# Patient Record
Sex: Male | Born: 1990 | Race: Black or African American | Hispanic: No | Marital: Single | Smoking: Current some day smoker
Health system: Southern US, Community
[De-identification: ages and names within clinical notes are randomized; demographics above are authoritative.]

## PROBLEM LIST (undated history)

## (undated) DIAGNOSIS — K219 Gastro-esophageal reflux disease without esophagitis: Secondary | ICD-10-CM

## (undated) DIAGNOSIS — F909 Attention-deficit hyperactivity disorder, unspecified type: Secondary | ICD-10-CM

## (undated) HISTORY — PX: OPEN REPAIR PERIARTICULAR FRACTURE / DISLOCATION ELBOW: SUR901

---
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
## 2018-01-30 ENCOUNTER — Other Ambulatory Visit: Payer: Self-pay

## 2018-01-30 ENCOUNTER — Encounter (HOSPITAL_COMMUNITY): Payer: Self-pay | Admitting: Emergency Medicine

## 2018-01-30 DIAGNOSIS — Y9389 Activity, other specified: Secondary | ICD-10-CM | POA: Diagnosis not present

## 2018-01-30 DIAGNOSIS — F17210 Nicotine dependence, cigarettes, uncomplicated: Secondary | ICD-10-CM | POA: Diagnosis not present

## 2018-01-30 DIAGNOSIS — W260XXA Contact with knife, initial encounter: Secondary | ICD-10-CM | POA: Diagnosis not present

## 2018-01-30 DIAGNOSIS — S61012A Laceration without foreign body of left thumb without damage to nail, initial encounter: Secondary | ICD-10-CM | POA: Insufficient documentation

## 2018-01-30 DIAGNOSIS — Y9289 Other specified places as the place of occurrence of the external cause: Secondary | ICD-10-CM | POA: Diagnosis not present

## 2018-01-30 DIAGNOSIS — Y990 Civilian activity done for income or pay: Secondary | ICD-10-CM | POA: Diagnosis not present

## 2018-01-30 NOTE — ED Triage Notes (Signed)
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

## 2018-01-31 ENCOUNTER — Emergency Department (HOSPITAL_COMMUNITY)
Admission: EM | Admit: 2018-01-31 | Discharge: 2018-01-31 | Disposition: A | Discharge status: Home / Self Care | Payer: No Typology Code available for payment source | Attending: Emergency Medicine | Admitting: Emergency Medicine

## 2018-01-31 DIAGNOSIS — S61012A Laceration without foreign body of left thumb without damage to nail, initial encounter: Secondary | ICD-10-CM

## 2018-01-31 MED ORDER — LIDOCAINE-EPINEPHRINE (PF) 2 %-1:200000 IJ SOLN
10.0000 mL | Freq: Once | INTRAMUSCULAR | Status: AC
  Administered 2018-01-31: 10 mL
  Filled 2018-01-31: qty 20

## 2018-01-31 NOTE — Discharge Instructions (Addendum)
1. Medications: Tylenol or ibuprofen for pain, 2. Treatment: ice for swelling, keep wound clean with warm soap and water and keep bandage dry 3. Follow Up: Please return in 10 days to have your stitches removed or sooner if you have concerns. Return to the emergency department for increased redness, drainage of pus from the wound   WOUND CARE  Remove bandage and wash wound gently with mild soap and warm water daily. Reapply a new bandage after cleaning wound  Continue daily cleansing with soap and water until stitches are removed.  Do not apply any ointments or creams to the wound while stitches are in place, as this may cause delayed healing. Return if you experience any of the following signs of infection: Swelling, redness, pus drainage, streaking, fever >101.0 F  Return if you experience excessive bleeding that does not stop after 15-20 minutes of constant, firm pressure.

## 2018-01-31 NOTE — ED Provider Notes (Signed)
[facility]-EMERGENCY DEPT Provider Note   CSN: [id number] Arrival date & time: 01/30/18  2208     History   Chief Complaint Chief Complaint  Patient presents with  . Laceration    HPI Messi Twedt is a 27 y.o. male presenting for evaluation of the laceration.  Patient states that it o'clock in the evening he cut his left thumb with a knife while at work.  He reports mild throbbing pain.  He tried to get the bleeding to stop with direct pressure, but was able to do so.  He denies numbness or tingling of the thumb.  He denies injury elsewhere.  Tetanus is up-to-date.  He is not on blood thinners.  He has no medical problems, takes no medications daily.  HPI  History reviewed. No pertinent past medical history.  There are no active problems to display for this patient.   History reviewed. No pertinent surgical history.      Home Medications    Prior to Admission medications   Not on File    Family History History reviewed. No pertinent family history.  Social History Social History   Tobacco Use  . Smoking status: Current Some Day Smoker  . Smokeless tobacco: Never Used  Substance Use Topics  . Alcohol use: Not on file  . Drug use: Not on file     Allergies   Patient has no known allergies.   Review of Systems Review of Systems  Skin: Positive for wound.  Neurological: Negative for numbness.  Hematological: Does not bruise/bleed easily.     Physical Exam Updated Vital Signs BP (!) 162/64 (BP Location: Right Arm)   Pulse 71   Temp 98.1 F (36.7 C) (Oral)   Resp 17   Ht 5\' 8"  (1.727 m)   Wt 90.7 kg   SpO2 99%   BMI 30.41 kg/m   Physical Exam  Constitutional: He is oriented to person, place, and time. He appears well-developed and well-nourished. No distress.  HENT:  Head: Normocephalic and atraumatic.  Eyes: EOM are normal.  Neck: Normal range of motion.  Pulmonary/Chest: Effort normal.  Abdominal: He exhibits  no distension.  Musculoskeletal: Normal range of motion.  Full active range of motion of the thumb without difficulty.  Strength against resistance intact.  Good cap refill. 2-point discrimination intact.   Neurological: He is alert and oriented to person, place, and time. No sensory deficit.  Skin: Skin is warm. Capillary refill takes less than 2 seconds. No rash noted.  1.5 cm laceration of the radial aspect of the left thumb at the DIP joint with active bleeding.  Psychiatric: He has a normal mood and affect.  Nursing note and vitals reviewed.    ED Treatments / Results  Labs (all labs ordered are listed, but only abnormal results are displayed) Labs Reviewed - No data to display  EKG None  Radiology No results found.  Procedures .Marland KitchenLaceration Repair Date/Time: 01/31/2018 6:14 AM Performed by: Alveria Apley, PA-C Authorized by: Alveria Apley, PA-C   Consent:    Consent obtained:  Verbal   Consent given by:  Patient   Risks discussed:  Pain, poor cosmetic result, poor wound healing and infection Anesthesia (see MAR for exact dosages):    Anesthesia method:  Local infiltration   Local anesthetic:  Lidocaine 2% WITH epi Laceration details:    Location:  Finger   Finger location:  L thumb   Length (cm):  1.5   Depth (mm):  2 Repair  type:    Repair type:  Simple Pre-procedure details:    Preparation:  Patient was prepped and draped in usual sterile fashion Exploration:    Hemostasis achieved with:  Epinephrine   Wound exploration: wound explored through full range of motion and entire depth of wound probed and visualized     Wound extent: no foreign bodies/material noted, no muscle damage noted, no nerve damage noted and no tendon damage noted   Treatment:    Area cleansed with:  Soap and water   Amount of cleaning:  Standard   Irrigation solution:  Sterile water   Irrigation method:  Syringe Skin repair:    Repair method:  Sutures   Suture size:  4-0    Suture material:  Prolene   Suture technique:  Simple interrupted   Number of sutures:  2 Approximation:    Approximation:  Close Post-procedure details:    Dressing:  Bulky dressing   Patient tolerance of procedure:  Tolerated well, no immediate complications   (including critical care time)  Medications Ordered in ED Medications  lidocaine-EPINEPHrine (XYLOCAINE W/EPI) 2 %-1:200000 (PF) injection 10 mL (10 mLs Infiltration Given by Other 01/31/18 0431)     Initial Impression / Assessment and Plan / ED Course  I have reviewed the triage vital signs and the nursing notes.  Pertinent labs & imaging results that were available during my care of the patient were reviewed by me and considered in my medical decision making (see chart for details).     Pt presenting for evaluation of thumb laceration.  Physical exam reassuring, he is neurovascularly intact.  Patient has continued bleeding despite pressure dressing from EMS.  Full active range of motion of the thumb, doubt underlying fracture.  Do not believe imaging is necessary at this time.  Hemostasis obtained with epi, sutures placed as described above.  Wound care instructions given.  At this time, patient appears safe for discharge.  Return precautions given.  Patient states he understands and agrees to plan.  Final Clinical Impressions(s) / ED Diagnoses   Final diagnoses:  Laceration of left thumb without foreign body without damage to nail, initial encounter    ED Discharge Orders    None       Alveria Apley, PA-C 01/31/18 0615    Derwood Kaplan, MD 01/31/18 [phone]

## 2018-01-31 NOTE — ED Notes (Signed)
Pt in the lobby c/o numbness and tingling in his thumb. His dressing was taken off and redone. When the dressing was removed, pt's thumb was gushing out blood, but controlled with a pressure dressing.

## 2020-07-15 ENCOUNTER — Emergency Department (HOSPITAL_COMMUNITY)
Admission: EM | Admit: 2020-07-15 | Discharge: 2020-07-16 | Disposition: A | Discharge status: Home / Self Care | Payer: Self-pay | Attending: Emergency Medicine | Admitting: Emergency Medicine

## 2020-07-15 ENCOUNTER — Encounter (HOSPITAL_COMMUNITY): Payer: Self-pay | Admitting: Emergency Medicine

## 2020-07-15 ENCOUNTER — Other Ambulatory Visit: Payer: Self-pay

## 2020-07-15 ENCOUNTER — Emergency Department (HOSPITAL_COMMUNITY): Payer: Self-pay

## 2020-07-15 DIAGNOSIS — F17200 Nicotine dependence, unspecified, uncomplicated: Secondary | ICD-10-CM | POA: Insufficient documentation

## 2020-07-15 DIAGNOSIS — S61210A Laceration without foreign body of right index finger without damage to nail, initial encounter: Secondary | ICD-10-CM | POA: Insufficient documentation

## 2020-07-15 DIAGNOSIS — Y990 Civilian activity done for income or pay: Secondary | ICD-10-CM | POA: Insufficient documentation

## 2020-07-15 DIAGNOSIS — W260XXA Contact with knife, initial encounter: Secondary | ICD-10-CM | POA: Insufficient documentation

## 2020-07-15 DIAGNOSIS — Z23 Encounter for immunization: Secondary | ICD-10-CM | POA: Insufficient documentation

## 2020-07-15 DIAGNOSIS — Y9389 Activity, other specified: Secondary | ICD-10-CM | POA: Insufficient documentation

## 2020-07-15 NOTE — ED Notes (Signed)
Temporary dressing with coban in place from triage. Pt calm and resting in room.

## 2020-07-15 NOTE — ED Triage Notes (Signed)
Patient cut his right pointer finger on knife while cleaning it at work.

## 2020-07-16 MED ORDER — TETANUS-DIPHTH-ACELL PERTUSSIS 5-2.5-18.5 LF-MCG/0.5 IM SUSY
0.5000 mL | PREFILLED_SYRINGE | Freq: Once | INTRAMUSCULAR | Status: AC
  Administered 2020-07-16: 0.5 mL via INTRAMUSCULAR
  Filled 2020-07-16: qty 0.5

## 2020-07-16 MED ORDER — LIDOCAINE HCL (PF) 1 % IJ SOLN
5.0000 mL | Freq: Once | INTRAMUSCULAR | Status: DC
  Filled 2020-07-16: qty 30

## 2020-07-16 NOTE — ED Notes (Signed)
Pt verbalized understanding of d/c, pain management, and follow up care. Ambulatory with steady gait. Pt given gauze and tape for bandage change at home.

## 2020-07-16 NOTE — Discharge Instructions (Signed)
Can take tylenol or motrin for pain. Keep sutures clean and dry. You will need to follow-up with urgent care or your primary care doctor to have sutures removed in about 1 week. Return to the ED for new or worsening symptoms.

## 2020-07-16 NOTE — ED Provider Notes (Signed)
[facility]-EMERGENCY DEPT Provider Note   CSN: [id number] Arrival date & time: 07/15/20  2317     History Chief Complaint  Patient presents with  . Extremity Laceration    Roy Kennedy is a 30 y.o. male.  The history is provided by the patient and medical records.   30 y.o. M here with laceration to right index finger that he sustained while cleaning a knife.  2cm laceration to dorsal aspect of finger, persistent bleeding.  He is right hand dominant.  Last tetanus unknown.  History reviewed. No pertinent past medical history.  There are no problems to display for this patient.   History reviewed. No pertinent surgical history.     History reviewed. No pertinent family history.  Social History   Tobacco Use  . Smoking status: Current Some Day Smoker  . Smokeless tobacco: Never Used  Vaping Use  . Vaping Use: Never used  Substance Use Topics  . Alcohol use: Yes  . Drug use: Not Currently    Home Medications Prior to Admission medications   Not on File    Allergies    Patient has no known allergies.  Review of Systems   Review of Systems  Skin: Positive for wound.  All other systems reviewed and are negative.   Physical Exam Updated Vital Signs BP 137/87 (BP Location: Right Arm)   Pulse 71   Temp 98.1 F (36.7 C) (Oral)   Resp 16   Ht 5\' 8"  (1.727 m)   Wt 92.1 kg   SpO2 100%   BMI 30.87 kg/m   Physical Exam Vitals and nursing note reviewed.  Constitutional:      Appearance: He is well-developed.  HENT:     Head: Normocephalic and atraumatic.  Eyes:     Conjunctiva/sclera: Conjunctivae normal.     Pupils: Pupils are equal, round, and reactive to light.  Cardiovascular:     Rate and Rhythm: Normal rate and regular rhythm.     Heart sounds: Normal heart sounds.  Pulmonary:     Effort: Pulmonary effort is normal. No respiratory distress.     Breath sounds: Normal breath sounds. No rhonchi.  Abdominal:     General:  Bowel sounds are normal.     Palpations: Abdomen is soft.  Musculoskeletal:        General: Normal range of motion.     Cervical back: Normal range of motion.     Comments: Right index finger with 2cm laceration to dorsal aspect of proximal phalanx, does have superficial bleeder present with some mild pulsatile bleeding observed, no tendon involvement, able to flex/extend as normal, good cap refill and distal sensation  Skin:    General: Skin is warm and dry.  Neurological:     Mental Status: He is alert and oriented to person, place, and time.     ED Results / Procedures / Treatments   Labs (all labs ordered are listed, but only abnormal results are displayed) Labs Reviewed - No data to display  EKG None  Radiology No results found.  Procedures Procedures   LACERATION REPAIR Performed by: Authorized by: Garlon Hatchet Consent: Verbal consent obtained. Risks and benefits: risks, benefits and alternatives were discussed Consent given by: patient Patient identity confirmed: provided demographic data Prepped and Draped in normal sterile fashion Wound explored  Laceration Location: right index finger  Laceration Length: 2cm  No Foreign Bodies seen or palpated  Anesthesia: local infiltration  Local anesthetic: lidocaine  1% without epinephrine  Anesthetic total: 5 ml  Irrigation method: syringe Amount of cleaning: standard  Skin closure: 4-0 vicryl rapide  and 4-0 prolene  Number of sutures: 4 total (1 deep, 3 superficial)  Technique: deep figure 8, simple interrupted superficial  Patient tolerance: Patient tolerated the procedure well with no immediate complications.   Medications Ordered in ED Medications  lidocaine (PF) (XYLOCAINE) 1 % injection 5 mL (has no administration in time range)  Tdap (BOOSTRIX) injection 0.5 mL (has no administration in time range)    ED Course  I have reviewed the triage vital signs and the nursing  notes.  Pertinent labs & imaging results that were available during my care of the patient were reviewed by me and considered in my medical decision making (see chart for details).    MDM Rules/Calculators/A&P  30 y.o. M here with laceration to right index finger.  He sustained this while cleaning a knife.  He has 2 cm laceration to dorsal proximal phalanx of right index finger.  There is obvious superficial bleeder.  No tendon involvement.  He is able to flex and extend as normal.  Laceration repaired as above-- superficial bleeder was tied off with figure-of-eight stitch, remainder repaired with simple interrupted.  Tolerated well.  Tetanus will be updated.  Discussed home wound care instructions.  Follow-up for suture removal in the next week.  Return here for new concerns.  Final Clinical Impression(s) / ED Diagnoses Final diagnoses:  Laceration of right index finger without foreign body without damage to nail, initial encounter    Rx / DC Orders ED Discharge Orders    None       Garlon Hatchet, PA-C 07/16/20 0110    Molpus, Jonny Ruiz, MD 07/16/20 [phone]

## 2020-09-22 ENCOUNTER — Other Ambulatory Visit: Payer: Self-pay

## 2020-09-22 ENCOUNTER — Emergency Department (HOSPITAL_COMMUNITY)
Admission: EM | Admit: 2020-09-22 | Discharge: 2020-09-22 | Disposition: A | Discharge status: Home / Self Care | Payer: BC Managed Care – PPO | Attending: Emergency Medicine | Admitting: Emergency Medicine

## 2020-09-22 DIAGNOSIS — W540XXA Bitten by dog, initial encounter: Secondary | ICD-10-CM | POA: Insufficient documentation

## 2020-09-22 DIAGNOSIS — Z23 Encounter for immunization: Secondary | ICD-10-CM | POA: Diagnosis not present

## 2020-09-22 DIAGNOSIS — F17200 Nicotine dependence, unspecified, uncomplicated: Secondary | ICD-10-CM | POA: Diagnosis not present

## 2020-09-22 DIAGNOSIS — Z2914 Encounter for prophylactic rabies immune globin: Secondary | ICD-10-CM | POA: Insufficient documentation

## 2020-09-22 DIAGNOSIS — S71152A Open bite, left thigh, initial encounter: Secondary | ICD-10-CM | POA: Insufficient documentation

## 2020-09-22 DIAGNOSIS — Y9389 Activity, other specified: Secondary | ICD-10-CM | POA: Diagnosis not present

## 2020-09-22 DIAGNOSIS — Y92009 Unspecified place in unspecified non-institutional (private) residence as the place of occurrence of the external cause: Secondary | ICD-10-CM | POA: Insufficient documentation

## 2020-09-22 MED ORDER — RABIES VACCINE, PCEC IM SUSR
1.0000 mL | Freq: Once | INTRAMUSCULAR | Status: AC
  Administered 2020-09-22: 21:00:00 1 mL via INTRAMUSCULAR
  Filled 2020-09-22: qty 1

## 2020-09-22 MED ORDER — RABIES IMMUNE GLOBULIN 300 UNIT/2ML IJ SOLN
300.0000 [IU] | Freq: Once | INTRAMUSCULAR | Status: AC
  Administered 2020-09-22: 21:00:00 300 [IU] via INTRAMUSCULAR
  Filled 2020-09-22: qty 2

## 2020-09-22 MED ORDER — RABIES IMMUNE GLOBULIN 150 UNIT/ML IM INJ: 20.0000 [IU]/kg | INJECTION | Freq: Once | INTRAMUSCULAR | Status: DC

## 2020-09-22 MED ORDER — AMOXICILLIN-POT CLAVULANATE 875-125 MG PO TABS: 1.0000 | ORAL_TABLET | Freq: Two times a day (BID) | ORAL | 0 refills | Status: AC

## 2020-09-22 MED ORDER — RABIES IMMUNE GLOBULIN 1500 UNIT/10ML IJ SOLN
1500.0000 [IU] | Freq: Once | INTRAMUSCULAR | Status: AC
  Administered 2020-09-22: 21:00:00 1500 [IU] via INTRAMUSCULAR
  Filled 2020-09-22: qty 10

## 2020-09-22 NOTE — ED Triage Notes (Signed)
Pt came from home via POV. C/c: dog bite yesterday. Pt reports pomeranian dog bit inside of left upper thigh. Dog owner reports that dog is up to date on vaccines. Pt reports there is a contusion in the bite area and the dog broke skin upon biting. Pt reports 3/10 pain opun site of animal bite

## 2020-09-22 NOTE — ED Provider Notes (Signed)
[facility]   CSN: [id number] Arrival date & time: 09/22/20  1747     History Chief Complaint  Patient presents with   Animal Bite    Roy Kennedy is a 30 y.o. male.  HPI  30 year old male presents to the emergency department today for evaluation of a dog bite that occurred yesterday.  States he was working on a project at [employer] when [employer] dog bit his left thigh.  He states that that people in the household told him that the dog is fully vaccinated.  He states that his Tdap is up-to-date.  He denies any other injuries.  He states that the dog is currently in quarantine  No past medical history on file.  There are no problems to display for this patient.   No past surgical history on file.     No family history on file.  Social History   Tobacco Use   Smoking status: Some Days    Pack years: 0.00   Smokeless tobacco: Never  Vaping Use   Vaping Use: Never used  Substance Use Topics   Alcohol use: Yes   Drug use: Not Currently    Home Medications Prior to Admission medications   Not on File    Allergies    Patient has no known allergies.  Review of Systems   Review of Systems  Constitutional:  Negative for fever.  Musculoskeletal:        Leg pain  Skin:  Positive for wound.   Physical Exam Updated Vital Signs BP 130/72 (BP Location: Right Arm)   Pulse 60   Temp 98.1 F (36.7 C) (Oral)   Resp 18   Ht 5\' 8"  (1.727 m)   Wt 88.5 kg   SpO2 99%   BMI 29.65 kg/m   Physical Exam Constitutional:      General: He is not in acute distress.    Appearance: He is well-developed.  Eyes:     Conjunctiva/sclera: Conjunctivae normal.  Cardiovascular:     Rate and Rhythm: Normal rate.  Pulmonary:     Effort: Pulmonary effort is normal.  Skin:    General: Skin is warm and dry.     Comments: Puncture noted to the left upper thigh  Neurological:     Mental Status: He is alert and  oriented to person, place, and time.    ED Results / Procedures / Treatments   Labs (all labs ordered are listed, but only abnormal results are displayed) Labs Reviewed - No data to display  EKG None  Radiology No results found.  Procedures Procedures   Medications Ordered in ED Medications  rabies immune globulin (HYPERAB/KEDRAB) injection 1,800 Units (has no administration in time range)  rabies vaccine (RABAVERT) injection 1 mL (has no administration in time range)    ED Course  I have reviewed the triage vital signs and the nursing notes.  Pertinent labs & imaging results that were available during my care of the patient were reviewed by me and considered in my medical decision making (see chart for details).    MDM Rules/Calculators/A&P                          Patient presents with laceration from a dog bite.   Wounds examined with visualization of the base and no foreign bodies seen.  Pt Alert and oriented, NAD, nontoxic, nonseptic appearing.  Patient tetanus UTD.  Patient states  that the dog is in quarantine however he is still requesting the rabies vaccine and immunoglobulin. Wounds not closed secondary to concern for infection. We'll discharge home with pain medication, Augmentin and requests for close follow-up with PCP or back in the ER.   Final Clinical Impression(s) / ED Diagnoses Final diagnoses:  None    Rx / DC Orders ED Discharge Orders     None        Rayne Du 09/22/20 1937    Arby Barrette, MD 09/27/20 2148

## 2020-09-22 NOTE — Discharge Instructions (Signed)
You were given a prescription for antibiotics. Please take the antibiotic prescription fully.   You will need to follow up as directed for the remainder of your rabies vaccine series.  Please follow up with your primary care provider within 5-7 days for re-evaluation of your symptoms. If you do not have a primary care provider, information for a healthcare clinic has been provided for you to make arrangements for follow up care. Please return to the emergency department for any new or worsening symptoms.

## 2020-09-28 ENCOUNTER — Telehealth: Payer: Self-pay | Admitting: *Deleted

## 2020-09-28 NOTE — Telephone Encounter (Signed)
Pt calling to question where he was to report for additional rabies vaccines. Seen in ED for dog bite, given first round. During call, pt did find on records he was given he was to report to [facility] on [address]Advised to [facility] for any additional questions that may arise.

## 2020-09-29 ENCOUNTER — Ambulatory Visit (HOSPITAL_COMMUNITY)
Admission: EM | Admit: 2020-09-29 | Discharge: 2020-09-29 | Disposition: A | Discharge status: Home / Self Care | Payer: Self-pay | Attending: Internal Medicine | Admitting: Internal Medicine

## 2020-09-29 ENCOUNTER — Other Ambulatory Visit: Payer: Self-pay

## 2020-09-29 DIAGNOSIS — Z203 Contact with and (suspected) exposure to rabies: Secondary | ICD-10-CM

## 2020-09-29 MED ORDER — RABIES VACCINE, PCEC IM SUSR
1.0000 mL | Freq: Once | INTRAMUSCULAR | Status: AC
  Administered 2020-09-29: 1 mL via INTRAMUSCULAR

## 2020-09-29 MED ORDER — RABIES VACCINE, PCEC IM SUSR
INTRAMUSCULAR | Status: AC
  Filled 2020-09-29: qty 1

## 2020-09-29 NOTE — ED Notes (Signed)
Called.

## 2020-09-29 NOTE — ED Notes (Signed)
Called [facility] regarding patient missing Day 3 of rabies vaccine. Pharmacist stated to go ahead and give rabies vaccine for Day 7. He request for pt to come back 3-7 days for the missed dose and 1 week after for the last dose.

## 2020-09-29 NOTE — ED Notes (Signed)
Pt presents for Day 7 of rabies vaccine. Pt states he missed Day 3.

## 2020-09-29 NOTE — ED Notes (Signed)
Reported phone call to Dr. Leonides Grills and Chales Salmon NP.

## 2020-10-17 ENCOUNTER — Ambulatory Visit (HOSPITAL_COMMUNITY)
Admission: EM | Admit: 2020-10-17 | Discharge: 2020-10-17 | Disposition: A | Discharge status: Home / Self Care | Payer: Self-pay | Attending: Internal Medicine | Admitting: Internal Medicine

## 2020-10-17 ENCOUNTER — Other Ambulatory Visit: Payer: Self-pay

## 2020-10-17 MED ORDER — RABIES VACCINE, PCEC IM SUSR
1.0000 mL | Freq: Once | INTRAMUSCULAR | Status: AC
  Administered 2020-10-17: 1 mL via INTRAMUSCULAR

## 2020-10-17 MED ORDER — RABIES VACCINE, PCEC IM SUSR
INTRAMUSCULAR | Status: AC
  Filled 2020-10-17: qty 1

## 2020-10-17 NOTE — ED Triage Notes (Signed)
Pt presents for 3rd Rabies shot

## 2020-10-26 ENCOUNTER — Encounter (HOSPITAL_COMMUNITY): Payer: Self-pay

## 2020-10-26 ENCOUNTER — Other Ambulatory Visit: Payer: Self-pay

## 2020-10-26 ENCOUNTER — Emergency Department (HOSPITAL_COMMUNITY)
Admission: EM | Admit: 2020-10-26 | Discharge: 2020-10-26 | Disposition: A | Discharge status: Home / Self Care | Payer: BC Managed Care – PPO | Attending: Emergency Medicine | Admitting: Emergency Medicine

## 2020-10-26 DIAGNOSIS — W268XXA Contact with other sharp object(s), not elsewhere classified, initial encounter: Secondary | ICD-10-CM | POA: Diagnosis not present

## 2020-10-26 DIAGNOSIS — S6991XA Unspecified injury of right wrist, hand and finger(s), initial encounter: Secondary | ICD-10-CM | POA: Diagnosis present

## 2020-10-26 DIAGNOSIS — S61210A Laceration without foreign body of right index finger without damage to nail, initial encounter: Secondary | ICD-10-CM | POA: Insufficient documentation

## 2020-10-26 DIAGNOSIS — Y990 Civilian activity done for income or pay: Secondary | ICD-10-CM | POA: Diagnosis not present

## 2020-10-26 DIAGNOSIS — Y9389 Activity, other specified: Secondary | ICD-10-CM | POA: Diagnosis not present

## 2020-10-26 DIAGNOSIS — T148XXA Other injury of unspecified body region, initial encounter: Secondary | ICD-10-CM

## 2020-10-26 DIAGNOSIS — F17200 Nicotine dependence, unspecified, uncomplicated: Secondary | ICD-10-CM | POA: Diagnosis not present

## 2020-10-26 NOTE — Discharge Instructions (Addendum)
Please see attached information on steri strip/skin tape after care. The tape will begin to roll off at the edges in about 5-6 days and at that time can be removed.   In the meantime please wear the splint for protection to prevent the steri strips from coming off sooner given they are over your finger joint.  While at work please keep the wound clean and dry. I would recommend wearing medical grade gloves to keep it clean.   Return to the ED for any signs of infection including redness/swelling around the wound, drainage of pus, fevers > 100.4, chills, or any other new/concerning symptoms

## 2020-10-26 NOTE — ED Triage Notes (Signed)
Pt sliced right index finger on a washer installation. Pt has clean bandage on finger currently, no bleeding noted.

## 2020-10-26 NOTE — ED Notes (Signed)
Pt is soaking his finger in iodine/saline solution.

## 2020-10-26 NOTE — ED Provider Notes (Signed)
[facility]-EMERGENCY DEPT Provider Note   CSN: [id number] Arrival date & time: 10/26/20  2002     History Chief Complaint  Patient presents with   Right Index Finger Laceration    Roy Kennedy is a 30 y.o. male who presents to the ED today with complaint of R index finger laceration that occurred about 5 hours ago.  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  He states that he went to lift the washer when he sliced his finger on a piece of metal.  He states that he had excessive bleeding for quite a while however it stopped with pressure.  He states he went home, clean the area with stuff that he uses to clean piercings with.  He states that afterwards it had a little bit of bleeding and so he placed a bandage.  He came to the ED for further evaluation to see if he needed sutures.  Patient does report that he has had quite a lot of needlesticks in the last month related to a dog bite and rabies vaccines.  He states that if he can avoid having sutures placed he would like to do so today.  His tetanus is up-to-date.  He has no other complaints at this time.  The history is provided by the patient and medical records.      History reviewed. No pertinent past medical history.  There are no problems to display for this patient.   History reviewed. No pertinent surgical history.     History reviewed. No pertinent family history.  Social History   Tobacco Use   Smoking status: Some Days    Pack years: 0.00   Smokeless tobacco: Never  Vaping Use   Vaping Use: Never used  Substance Use Topics   Alcohol use: Yes   Drug use: Not Currently    Home Medications Prior to Admission medications   Not on File    Allergies    Patient has no known allergies.  Review of Systems   Review of Systems  Constitutional:  Negative for chills and fever.  Musculoskeletal:  Positive for arthralgias.  Skin:  Positive for wound.  All other  systems reviewed and are negative.  Physical Exam Updated Vital Signs BP 118/86   Pulse 66   Temp 98.1 F (36.7 C) (Oral)   Resp 18   SpO2 99%   Physical Exam Vitals and nursing note reviewed.  Constitutional:      Appearance: He is not ill-appearing.  HENT:     Head: Normocephalic and atraumatic.  Eyes:     Conjunctiva/sclera: Conjunctivae normal.  Cardiovascular:     Rate and Rhythm: Normal rate and regular rhythm.  Pulmonary:     Effort: Pulmonary effort is normal.     Breath sounds: Normal breath sounds.  Musculoskeletal:     Comments: 1 cm laceration/skin avulsion to distal aspect of R index finger; bleeding controlled. ROM intact to MCP, PIP, and DIP joint. Cap refill < 2 seconds. Sensation intact throughout. 2+ radial pulse.   Skin:    General: Skin is warm and dry.     Coloration: Skin is not jaundiced.  Neurological:     Mental Status: He is alert.    ED Results / Procedures / Treatments   Labs (all labs ordered are listed, but only abnormal results are displayed) Labs Reviewed - No data to display  EKG None  Radiology No results found.  Procedures .Marland KitchenLaceration  Repair  Date/Time: 10/26/2020 9:14 PM Performed by: Tanda Rockers, PA-C Authorized by: Tanda Rockers, PA-C   Consent:    Consent obtained:  Verbal   Consent given by:  Patient   Risks discussed:  Infection, pain, poor cosmetic result and poor wound healing Anesthesia:    Anesthesia method:  None Laceration details:    Location:  Finger   Finger location:  R index finger   Length (cm):  1   Depth (mm):  1 Treatment:    Area cleansed with:  Povidone-iodine   Irrigation solution:  Sterile saline Skin repair:    Repair method:  Steri-Strips   Number of Steri-Strips:  4 Approximation:    Approximation:  Close Repair type:    Repair type:  Simple Post-procedure details:    Dressing:  Splint for protection   Procedure completion:  Tolerated well, no immediate complications    Medications Ordered in ED Medications - No data to display  ED Course  I have reviewed the triage vital signs and the nursing notes.  Pertinent labs & imaging results that were available during my care of the patient were reviewed by me and considered in my medical decision making (see chart for details).    MDM Rules/Calculators/A&P                          30 year old male who presents to the ED today for laceration to right index finger from installing a washer earlier today.  On arrival to the ED patient has a 1 cm laceration to the distal aspect of his right index finger.  It does appear that he slightly avulsed the most distal aspect of his skin.  The skin is still intact however.  His bleeding is controlled.  He is neurovascularly intact throughout.  Patient is candid that he has recently received a lot of injection secondary to rabies vaccine and laceration from dog bite.  He is hesitant to get sutures done today and would like to avoid at all costs.  On my exam it does appear more consistent with a skin avulsion laceration and then a full-thickness laceration.  Given the cut is at the very distal aspect of the finger it would require very deep sutures to hold the distal aspect of the skin in place as I suspect normal suturing will stare right through the skin.  We will plan to soak hand with iodine and normal saline and reevaluate however I do feel that Steri-Strips applied would be appropriate at this time and patient would rather try this route instead.  Wound irrigated. Steri strips applied and splint provided for protection. Pt stable for discharge at this time.   This note was prepared using Dragon voice recognition software and may include unintentional dictation errors due to the inherent limitations of voice recognition software.   Final Clinical Impression(s) / ED Diagnoses Final diagnoses:  Laceration of right index finger without foreign body without damage to nail,  initial encounter  Skin avulsion    Rx / DC Orders ED Discharge Orders     None        Discharge Instructions      Please see attached information on steri strip/skin tape after care. The tape will begin to roll off at the edges in about 5-6 days and at that time can be removed.   In the meantime please wear the splint for protection to prevent the steri strips from coming off sooner given  they are over your finger joint.  While at work please keep the wound clean and dry. I would recommend wearing medical grade gloves to keep it clean.   Return to the ED for any signs of infection including redness/swelling around the wound, drainage of pus, fevers > 100.4, chills, or any other new/concerning symptoms       Tanda Rockers, Cordelia Poche 10/26/20 2117    Koleen Distance, MD 10/26/20 [phone]

## 2021-03-13 ENCOUNTER — Other Ambulatory Visit: Payer: Self-pay

## 2021-03-13 ENCOUNTER — Emergency Department (HOSPITAL_COMMUNITY): Payer: BC Managed Care – PPO

## 2021-03-13 ENCOUNTER — Emergency Department (HOSPITAL_COMMUNITY)
Admission: EM | Admit: 2021-03-13 | Discharge: 2021-03-13 | Disposition: A | Discharge status: Home / Self Care | Payer: BC Managed Care – PPO | Attending: Emergency Medicine | Admitting: Emergency Medicine

## 2021-03-13 ENCOUNTER — Encounter (HOSPITAL_COMMUNITY): Payer: Self-pay

## 2021-03-13 DIAGNOSIS — S99921A Unspecified injury of right foot, initial encounter: Secondary | ICD-10-CM | POA: Diagnosis present

## 2021-03-13 DIAGNOSIS — F17210 Nicotine dependence, cigarettes, uncomplicated: Secondary | ICD-10-CM | POA: Insufficient documentation

## 2021-03-13 DIAGNOSIS — W19XXXA Unspecified fall, initial encounter: Secondary | ICD-10-CM | POA: Diagnosis not present

## 2021-03-13 DIAGNOSIS — S86011A Strain of right Achilles tendon, initial encounter: Secondary | ICD-10-CM | POA: Diagnosis not present

## 2021-03-13 MED ORDER — IBUPROFEN 600 MG PO TABS: 600.0000 mg | ORAL_TABLET | Freq: Four times a day (QID) | ORAL | 0 refills | Status: AC | PRN

## 2021-03-13 MED ORDER — CYCLOBENZAPRINE HCL 10 MG PO TABS: 10.0000 mg | ORAL_TABLET | Freq: Two times a day (BID) | ORAL | 0 refills | Status: AC | PRN

## 2021-03-13 NOTE — ED Triage Notes (Signed)
Patient reports that he fell off of a 26 foot box truck and his right foot got caught in a stair. Patient c/o increased pain with weight bearing. Patient denies hitting his head or having LOC.

## 2021-03-13 NOTE — ED Provider Notes (Signed)
[facility]-EMERGENCY DEPT Provider Note   CSN: [id number] Arrival date & time: 03/13/21  0731     History Chief Complaint  Patient presents with   Foot Injury    Kemon Devincenzi is a 30 y.o. male.  The history is provided by the patient. No language interpreter was used.  Foot Injury Associated symptoms: no fever    30 year old male who presents for evaluation of foot injury.  Patient report today while at work he was on top of a 22 foot box truck.  He was stepping down on the step, lost his footing and his foot got caught in between the step.  He denies falling down to the ground and was able to remove his foot from the the steps but when he stepped down he noticed pain towards his Achilles tendon region and having difficulty bearing weight.  He notices associated swelling but denies any numbness.  He denies any significant foot pain.  No other injury.  Pain is sharp throbbing moderate in intensity radiates towards his calf.  History reviewed. No pertinent past medical history.  There are no problems to display for this patient.   Past Surgical History:  Procedure Laterality Date   OPEN REPAIR PERIARTICULAR FRACTURE / DISLOCATION ELBOW         Family History  Problem Relation Age of Onset   Hypertension Mother     Social History   Tobacco Use   Smoking status: Some Days    Types: Cigarettes, Cigars   Smokeless tobacco: Never  Vaping Use   Vaping Use: Never used  Substance Use Topics   Alcohol use: Yes   Drug use: Not Currently    Home Medications Prior to Admission medications   Not on File    Allergies    Patient has no known allergies.  Review of Systems   Review of Systems  Constitutional:  Negative for fever.  Skin:  Negative for wound.  Neurological:  Negative for numbness.   Physical Exam Updated Vital Signs BP (!) 152/87 (BP Location: Left Arm)   Pulse 86   Temp 97.9 F (36.6 C) (Oral)   Resp 18   Ht 5\' 8"  (1.727  m)   Wt 89.4 kg   SpO2 96%   BMI 29.95 kg/m   Physical Exam Vitals and nursing note reviewed.  Constitutional:      General: He is not in acute distress.    Appearance: He is well-developed.  HENT:     Head: Atraumatic.  Eyes:     Conjunctiva/sclera: Conjunctivae normal.  Musculoskeletal:        General: Tenderness (Left lower extremity: Tenderness along the Achilles region with associated swelling noted.  Patient able to dorsiflex and plantarflex his foot.  Intact pedal pulse, brisk cap refill and no tenderness to the foot itself.) present.     Cervical back: Neck supple.     Comments: Tenderness to left calf  Skin:    Findings: No rash.  Neurological:     Mental Status: He is alert.    ED Results / Procedures / Treatments   Labs (all labs ordered are listed, but only abnormal results are displayed) Labs Reviewed - No data to display  EKG None  Radiology DG Foot Complete Right  Result Date: 03/13/2021 CLINICAL DATA:  Fall, injury, pain EXAM: RIGHT FOOT COMPLETE - 3+ VIEW COMPARISON:  None. 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 No  acute osseous finding, fracture, or malalignment. On the lateral view, there is slight soft tissue thickening posteriorly along the Achilles tendon, overall nonspecific by plain radiography. IMPRESSION: Right first MTP joint osteoarthritis, compatible with hallux rigidus. No acute osseous finding Nonspecific slight soft tissue thickening of the Achilles region posteriorly. Correlate with exam. Electronically Signed   By: Judie Petit.  Shick M.D.   On: 03/13/2021 08:25    Procedures Procedures   Medications Ordered in ED Medications - No data to display  ED Course  I have reviewed the triage vital signs and the nursing notes.  Pertinent labs & imaging results that were available during my care of the patient were reviewed by me and considered in my medical decision making  (see chart for details).    MDM Rules/Calculators/A&P                           BP (!) 152/87 (BP Location: Left Arm)   Pulse 86   Temp 97.9 F (36.6 C) (Oral)   Resp 18   Ht 5\' 8"  (1.727 m)   Wt 89.4 kg   SpO2 96%   BMI 29.95 kg/m   Final Clinical Impression(s) / ED Diagnoses Final diagnoses:  Right foot injury  Strain of right Achilles tendon, initial encounter    Rx / DC Orders ED Discharge Orders          Ordered    ibuprofen (ADVIL) 600 MG tablet  Every 6 hours PRN        03/13/21 0900    cyclobenzaprine (FLEXERIL) 10 MG tablet  2 times daily PRN        03/13/21 0900           8:54 AM Patient reported his R foot got caught in between the step of truck while he was trying to get down from it.  Incident happened this morning.  Pain is primarily towards his right Achilles region.  X-ray obtained showing nonspecific soft tissues thickening of the Achilles region posteriorly.  I suspect this could likely be a partial tear or strain as patient is able to dorsiflex and plantarflex his foot.  We will place foot in a cam walker boot, provide NSAIDs for pain management the patient will need to follow-up with orthopedist for further Evaluation and likely MRI.   03/15/21, PA-C 03/13/21 0901    03/15/21, MD 03/14/21 [phone]

## 2021-03-13 NOTE — Progress Notes (Signed)
Orthopedic Tech Progress Note Patient Details:  Roy Kennedy [birth year] [id number]  Ortho Devices Type of Ortho Device: CAM walker Ortho Device/Splint Location: right Ortho Device/Splint Interventions: Application   Post Interventions Patient Tolerated: Well Instructions Provided: Care of device, Adjustment of device  Saul Fordyce 03/13/2021, 10:15 AM

## 2021-03-13 NOTE — Discharge Instructions (Signed)
Your injury is likely due to an Achilles strain or possibly an Achilles tendon tear.  Please wear boot for support, call and follow-up closely with orthopedist for further management.  You may benefit from an MRI for further assessment.

## 2021-03-13 NOTE — ED Notes (Signed)
Ortho called for a Cam walker.

## 2021-03-16 ENCOUNTER — Ambulatory Visit (INDEPENDENT_AMBULATORY_CARE_PROVIDER_SITE_OTHER): Payer: BC Managed Care – PPO | Admitting: Orthopaedic Surgery

## 2021-03-16 ENCOUNTER — Other Ambulatory Visit: Payer: Self-pay

## 2021-03-16 ENCOUNTER — Ambulatory Visit (HOSPITAL_BASED_OUTPATIENT_CLINIC_OR_DEPARTMENT_OTHER): Payer: Self-pay | Admitting: Orthopaedic Surgery

## 2021-03-16 ENCOUNTER — Other Ambulatory Visit (HOSPITAL_BASED_OUTPATIENT_CLINIC_OR_DEPARTMENT_OTHER): Payer: Self-pay

## 2021-03-16 DIAGNOSIS — M7661 Achilles tendinitis, right leg: Secondary | ICD-10-CM | POA: Diagnosis not present

## 2021-03-16 MED ORDER — ACETAMINOPHEN 500 MG PO TABS
500.0000 mg | ORAL_TABLET | Freq: Three times a day (TID) | ORAL | 0 refills | Status: AC
  Filled 2021-03-16: qty 30, 10d supply, fill #0

## 2021-03-16 MED ORDER — ASPIRIN EC 325 MG PO TBEC
325.0000 mg | DELAYED_RELEASE_TABLET | Freq: Every day | ORAL | 0 refills | Status: DC
  Filled 2021-03-16: qty 30, 30d supply, fill #0

## 2021-03-16 MED ORDER — IBUPROFEN 800 MG PO TABS
800.0000 mg | ORAL_TABLET | Freq: Three times a day (TID) | ORAL | 0 refills | Status: AC
  Filled 2021-03-16: qty 30, 10d supply, fill #0

## 2021-03-16 MED ORDER — OXYCODONE HCL 5 MG PO TABS
5.0000 mg | ORAL_TABLET | ORAL | 0 refills | Status: DC | PRN
  Filled 2021-03-16: qty 20, 4d supply, fill #0

## 2021-03-16 NOTE — H&P (View-Only) (Signed)
Chief Complaint: right achilles tear     History of Present Illness:   Roy Kennedy is a 30 y.o. male presents today after fall off of a box truck on 28 November.  At that time he felt a pop in the right heel subsequently presented to the emergency room.  He was worked up for possible Achilles injury and sent for further evaluation.  Since that time he has been in a short cam boot placing weight on the right heel.  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  He is otherwise healthy.  He does have a history of smoking cigarettes although is no longer doing this.  He is otherwise healthy.  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    Surgical History:   None  PMH/PSH/Family History/Social History/Meds/Allergies:   No past medical history on file. Past Surgical History:  Procedure Laterality Date   OPEN REPAIR PERIARTICULAR FRACTURE / DISLOCATION ELBOW     Social History   Socioeconomic History   Marital status: Single    Spouse name: Not on file   Number of children: Not on file   Years of education: Not on file   Highest education level: Not on file  Occupational History   Not on file  Tobacco Use   Smoking status: Some Days    Types: Cigarettes, Cigars   Smokeless tobacco: Never  Vaping Use   Vaping Use: Never used  Substance and Sexual Activity   Alcohol use: Yes   Drug use: Not Currently   Sexual activity: Not on file  Other Topics Concern   Not on file  Social History Narrative   Not on file   Social Determinants of Health   Financial Resource Strain: Not on file  Food Insecurity: Not on file  Transportation Needs: Not on file  Physical Activity: Not on file  Stress: Not on file  Social Connections: Not on file   Family History  Problem Relation Age of Onset   Hypertension Mother    No Known Allergies Current Outpatient Medications  Medication Sig Dispense Refill   acetaminophen (TYLENOL)  500 MG tablet Take 1 tablet (500 mg total) by mouth every 8 (eight) hours for 10 days. 30 tablet 0   aspirin EC 325 MG tablet Take 1 tablet (325 mg total) by mouth daily. 30 tablet 0   ibuprofen (ADVIL) 800 MG tablet Take 1 tablet (800 mg total) by mouth every 8 (eight) hours for 10 days. Please take with food, please alternate with acetaminophen 30 tablet 0   oxyCODONE (OXY IR/ROXICODONE) 5 MG immediate release tablet Take 1 tablet (5 mg total) by mouth every 4 (four) hours as needed (severe pain). 20 tablet 0   cyclobenzaprine (FLEXERIL) 10 MG tablet Take 1 tablet (10 mg total) by mouth 2 (two) times daily as needed for muscle spasms. 20 tablet 0   ibuprofen (ADVIL) 600 MG tablet Take 1 tablet (600 mg total) by mouth every 6 (six) hours as needed for moderate pain. 30 tablet 0   No current facility-administered medications for this visit.   No results found.  Review of Systems:   A ROS was performed including pertinent positives and negatives as documented in the HPI.  Physical Exam :   Constitutional: NAD and appears stated  age Neurological: Alert and oriented Psych: Appropriate affect and cooperative There were no vitals taken for this visit.   Comprehensive Musculoskeletal Exam:   Tenderness and bruising about the right Achilles with a palpable step-off.  Foot is in dorsiflexion.  With prone [place] testing he is not able to plantarflex the right foot.  Otherwise sensation is intact light touch in all distributions her foot.  2+ dorsalis pedis pulse  Imaging:   Xray (3 views right ankle): Normal    I personally reviewed and interpreted the radiographs.   Assessment:   30 year old male with right Achilles tendon tear after he stepped off a box truck.  At this time I have talked with him about the surgical versus nonsurgical treatment.  We did describe that the recovery will be similar in terms of the amount of time off of work particularly heavy duty lifting and moving up and  down into trucks.  That being said he is in a good amount of resting dorsiflexion and he is a relatively high demand person.  He is a Systems analyst.  I do believe that he is at risk for a pushoff deficit on the right side with the amount of functional lengthening that currently exist.  After discussion we have decided to proceed with right Achilles tendon repair.  Plan :    -Plan for right Achilles tendon repair    After a lengthy discussion of treatment options, including risks, benefits, alternatives, complications of surgical and nonsurgical conservative options, the patient elected surgical repair.   The patient  is aware of the material risks  and complications including, but not limited to injury to adjacent structures, neurovascular injury, infection, numbness, bleeding, implant failure, thermal burns, stiffness, persistent pain, failure to heal, disease transmission from allograft, need for further surgery, dislocation, anesthetic risks, blood clots, risks of death,and others. The probabilities of surgical success and failure discussed with patient given their particular co-morbidities.The time and nature of expected rehabilitation and recovery was discussed.The patient's questions were all answered preoperatively.  No barriers to understanding were noted. I explained the natural history of the disease process and Rx rationale.  I explained to the patient what I considered to be reasonable expectations given their personal situation.  The final treatment plan was arrived at through a shared patient decision making process model.    I personally saw and evaluated the patient, and participated in the management and treatment plan.  Huel Cote, MD Attending Physician, Orthopedic Surgery  This document was dictated using Dragon voice recognition software. A reasonable attempt at proof reading has been made to minimize errors.

## 2021-03-16 NOTE — Progress Notes (Signed)
Chief Complaint: right achilles tear     History of Present Illness:   Roy Kennedy is a 30 y.o. male presents today after fall off of a box truck on 28 November.  At that time he felt a pop in the right heel subsequently presented to the emergency room.  He was worked up for possible Achilles injury and sent for further evaluation.  Since that time he has been in a short cam boot placing weight on the right heel.  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  He is otherwise healthy.  He does have a history of smoking cigarettes although is no longer doing this.  He is otherwise healthy.  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    Surgical History:   None  PMH/PSH/Family History/Social History/Meds/Allergies:   No past medical history on file. Past Surgical History:  Procedure Laterality Date   OPEN REPAIR PERIARTICULAR FRACTURE / DISLOCATION ELBOW     Social History   Socioeconomic History   Marital status: Single    Spouse name: Not on file   Number of children: Not on file   Years of education: Not on file   Highest education level: Not on file  Occupational History   Not on file  Tobacco Use   Smoking status: Some Days    Types: Cigarettes, Cigars   Smokeless tobacco: Never  Vaping Use   Vaping Use: Never used  Substance and Sexual Activity   Alcohol use: Yes   Drug use: Not Currently   Sexual activity: Not on file  Other Topics Concern   Not on file  Social History Narrative   Not on file   Social Determinants of Health   Financial Resource Strain: Not on file  Food Insecurity: Not on file  Transportation Needs: Not on file  Physical Activity: Not on file  Stress: Not on file  Social Connections: Not on file   Family History  Problem Relation Age of Onset   Hypertension Mother    No Known Allergies Current Outpatient Medications  Medication Sig Dispense Refill   acetaminophen (TYLENOL)  500 MG tablet Take 1 tablet (500 mg total) by mouth every 8 (eight) hours for 10 days. 30 tablet 0   aspirin EC 325 MG tablet Take 1 tablet (325 mg total) by mouth daily. 30 tablet 0   ibuprofen (ADVIL) 800 MG tablet Take 1 tablet (800 mg total) by mouth every 8 (eight) hours for 10 days. Please take with food, please alternate with acetaminophen 30 tablet 0   oxyCODONE (OXY IR/ROXICODONE) 5 MG immediate release tablet Take 1 tablet (5 mg total) by mouth every 4 (four) hours as needed (severe pain). 20 tablet 0   cyclobenzaprine (FLEXERIL) 10 MG tablet Take 1 tablet (10 mg total) by mouth 2 (two) times daily as needed for muscle spasms. 20 tablet 0   ibuprofen (ADVIL) 600 MG tablet Take 1 tablet (600 mg total) by mouth every 6 (six) hours as needed for moderate pain. 30 tablet 0   No current facility-administered medications for this visit.   No results found.  Review of Systems:   A ROS was performed including pertinent positives and negatives as documented in the HPI.  Physical Exam :   Constitutional: NAD and appears stated  age Neurological: Alert and oriented Psych: Appropriate affect and cooperative There were no vitals taken for this visit.   Comprehensive Musculoskeletal Exam:   Tenderness and bruising about the right Achilles with a palpable step-off.  Foot is in dorsiflexion.  With prone [place] testing he is not able to plantarflex the right foot.  Otherwise sensation is intact light touch in all distributions her foot.  2+ dorsalis pedis pulse  Imaging:   Xray (3 views right ankle): Normal    I personally reviewed and interpreted the radiographs.   Assessment:   30 year old male with right Achilles tendon tear after he stepped off a box truck.  At this time I have talked with him about the surgical versus nonsurgical treatment.  We did describe that the recovery will be similar in terms of the amount of time off of work particularly heavy duty lifting and moving up and  down into trucks.  That being said he is in a good amount of resting dorsiflexion and he is a relatively high demand person.  He is a Systems analyst.  I do believe that he is at risk for a pushoff deficit on the right side with the amount of functional lengthening that currently exist.  After discussion we have decided to proceed with right Achilles tendon repair.  Plan :    -Plan for right Achilles tendon repair    After a lengthy discussion of treatment options, including risks, benefits, alternatives, complications of surgical and nonsurgical conservative options, the patient elected surgical repair.   The patient  is aware of the material risks  and complications including, but not limited to injury to adjacent structures, neurovascular injury, infection, numbness, bleeding, implant failure, thermal burns, stiffness, persistent pain, failure to heal, disease transmission from allograft, need for further surgery, dislocation, anesthetic risks, blood clots, risks of death,and others. The probabilities of surgical success and failure discussed with patient given their particular co-morbidities.The time and nature of expected rehabilitation and recovery was discussed.The patient's questions were all answered preoperatively.  No barriers to understanding were noted. I explained the natural history of the disease process and Rx rationale.  I explained to the patient what I considered to be reasonable expectations given their personal situation.  The final treatment plan was arrived at through a shared patient decision making process model.    I personally saw and evaluated the patient, and participated in the management and treatment plan.  Huel Cote, MD Attending Physician, Orthopedic Surgery  This document was dictated using Dragon voice recognition software. A reasonable attempt at proof reading has been made to minimize errors.

## 2021-03-17 ENCOUNTER — Encounter (HOSPITAL_COMMUNITY): Payer: Self-pay | Admitting: Orthopaedic Surgery

## 2021-03-17 ENCOUNTER — Other Ambulatory Visit: Payer: Self-pay

## 2021-03-17 NOTE — Progress Notes (Signed)
Spoke with pt for pre-op call. Pt denies cardiac history, HTN or Diabetes.   Pt's surgery is scheduled as ambulatory so no Covid test is required prior to surgery.  

## 2021-03-20 ENCOUNTER — Ambulatory Visit (HOSPITAL_COMMUNITY): Payer: BC Managed Care – PPO | Admitting: Anesthesiology

## 2021-03-20 ENCOUNTER — Encounter (HOSPITAL_COMMUNITY)
Admission: RE | Disposition: A | Discharge status: Home / Self Care | Payer: Self-pay | Source: Home / Self Care | Attending: Orthopaedic Surgery

## 2021-03-20 ENCOUNTER — Other Ambulatory Visit: Payer: Self-pay

## 2021-03-20 ENCOUNTER — Encounter (HOSPITAL_COMMUNITY): Payer: Self-pay | Admitting: Orthopaedic Surgery

## 2021-03-20 ENCOUNTER — Ambulatory Visit (HOSPITAL_COMMUNITY)
Admission: RE | Admit: 2021-03-20 | Discharge: 2021-03-20 | Disposition: A | Discharge status: Home / Self Care | Payer: BC Managed Care – PPO | Attending: Orthopaedic Surgery | Admitting: Orthopaedic Surgery

## 2021-03-20 DIAGNOSIS — K219 Gastro-esophageal reflux disease without esophagitis: Secondary | ICD-10-CM | POA: Insufficient documentation

## 2021-03-20 DIAGNOSIS — F909 Attention-deficit hyperactivity disorder, unspecified type: Secondary | ICD-10-CM | POA: Insufficient documentation

## 2021-03-20 DIAGNOSIS — S86011A Strain of right Achilles tendon, initial encounter: Secondary | ICD-10-CM

## 2021-03-20 DIAGNOSIS — Z87891 Personal history of nicotine dependence: Secondary | ICD-10-CM | POA: Insufficient documentation

## 2021-03-20 DIAGNOSIS — M7661 Achilles tendinitis, right leg: Secondary | ICD-10-CM

## 2021-03-20 DIAGNOSIS — Y9339 Activity, other involving climbing, rappelling and jumping off: Secondary | ICD-10-CM | POA: Diagnosis not present

## 2021-03-20 HISTORY — PX: ACHILLES TENDON SURGERY: SHX542

## 2021-03-20 HISTORY — DX: Gastro-esophageal reflux disease without esophagitis: K21.9

## 2021-03-20 HISTORY — DX: Attention-deficit hyperactivity disorder, unspecified type: F90.9

## 2021-03-20 LAB — CBC
HCT: 41.3 % (ref 39.0–52.0)
Hemoglobin: 13.9 g/dL (ref 13.0–17.0)
MCH: 32.7 pg (ref 26.0–34.0)
MCHC: 33.7 g/dL (ref 30.0–36.0)
MCV: 97.2 fL (ref 80.0–100.0)
Platelets: 152 10*3/uL (ref 150–400)
RBC: 4.25 MIL/uL (ref 4.22–5.81)
RDW: 11.1 % — ABNORMAL LOW (ref 11.5–15.5)
WBC: 4.6 10*3/uL (ref 4.0–10.5)
nRBC: 0 % (ref 0.0–0.2)

## 2021-03-20 SURGERY — REPAIR, TENDON, ACHILLES
Anesthesia: General | Laterality: Right

## 2021-03-20 MED ORDER — FENTANYL CITRATE (PF) 100 MCG/2ML IJ SOLN: 100.0000 ug | Freq: Once | INTRAMUSCULAR | Status: AC

## 2021-03-20 MED ORDER — LIDOCAINE 2% (20 MG/ML) 5 ML SYRINGE
INTRAMUSCULAR | Status: AC
  Filled 2021-03-20: qty 5

## 2021-03-20 MED ORDER — BUPIVACAINE-EPINEPHRINE (PF) 0.5% -1:200000 IJ SOLN
INTRAMUSCULAR | Status: DC | PRN
  Administered 2021-03-20: 30 mL via PERINEURAL
  Administered 2021-03-20: 10 mL via PERINEURAL

## 2021-03-20 MED ORDER — PROPOFOL 10 MG/ML IV BOLUS
INTRAVENOUS | Status: AC
  Filled 2021-03-20: qty 20

## 2021-03-20 MED ORDER — CHLORHEXIDINE GLUCONATE 0.12 % MT SOLN
15.0000 mL | Freq: Once | OROMUCOSAL | Status: AC
  Administered 2021-03-20: 15 mL via OROMUCOSAL
  Filled 2021-03-20: qty 15

## 2021-03-20 MED ORDER — DEXAMETHASONE SODIUM PHOSPHATE 10 MG/ML IJ SOLN
INTRAMUSCULAR | Status: DC | PRN
  Administered 2021-03-20: 10 mg via INTRAVENOUS

## 2021-03-20 MED ORDER — LIDOCAINE 2% (20 MG/ML) 5 ML SYRINGE
INTRAMUSCULAR | Status: DC | PRN
  Administered 2021-03-20: 60 mg via INTRAVENOUS

## 2021-03-20 MED ORDER — ONDANSETRON HCL 4 MG/2ML IJ SOLN
INTRAMUSCULAR | Status: DC | PRN
  Administered 2021-03-20: 4 mg via INTRAVENOUS

## 2021-03-20 MED ORDER — ACETAMINOPHEN 500 MG PO TABS
1000.0000 mg | ORAL_TABLET | Freq: Once | ORAL | Status: AC
  Administered 2021-03-20: 1000 mg via ORAL
  Filled 2021-03-20: qty 2

## 2021-03-20 MED ORDER — FENTANYL CITRATE (PF) 250 MCG/5ML IJ SOLN
INTRAMUSCULAR | Status: AC
  Filled 2021-03-20: qty 5

## 2021-03-20 MED ORDER — CEFAZOLIN SODIUM-DEXTROSE 2-4 GM/100ML-% IV SOLN
2.0000 g | INTRAVENOUS | Status: AC
  Administered 2021-03-20: 2 g via INTRAVENOUS
  Filled 2021-03-20: qty 100

## 2021-03-20 MED ORDER — PROPOFOL 10 MG/ML IV BOLUS
INTRAVENOUS | Status: DC | PRN
  Administered 2021-03-20 (×2): 200 mg via INTRAVENOUS

## 2021-03-20 MED ORDER — PROMETHAZINE HCL 25 MG/ML IJ SOLN: 6.2500 mg | INTRAMUSCULAR | Status: DC | PRN

## 2021-03-20 MED ORDER — FENTANYL CITRATE (PF) 250 MCG/5ML IJ SOLN
INTRAMUSCULAR | Status: DC | PRN
  Administered 2021-03-20: 150 ug via INTRAVENOUS

## 2021-03-20 MED ORDER — SUGAMMADEX SODIUM 200 MG/2ML IV SOLN
INTRAVENOUS | Status: DC | PRN
  Administered 2021-03-20 (×2): 200 mg via INTRAVENOUS

## 2021-03-20 MED ORDER — ONDANSETRON HCL 4 MG/2ML IJ SOLN
INTRAMUSCULAR | Status: AC
  Filled 2021-03-20: qty 2

## 2021-03-20 MED ORDER — DEXAMETHASONE SODIUM PHOSPHATE 10 MG/ML IJ SOLN
INTRAMUSCULAR | Status: AC
  Filled 2021-03-20: qty 1

## 2021-03-20 MED ORDER — MIDAZOLAM HCL 2 MG/2ML IJ SOLN
INTRAMUSCULAR | Status: AC
  Filled 2021-03-20: qty 2

## 2021-03-20 MED ORDER — MIDAZOLAM HCL 2 MG/2ML IJ SOLN
INTRAMUSCULAR | Status: AC
  Administered 2021-03-20: 2 mg via INTRAVENOUS
  Filled 2021-03-20: qty 2

## 2021-03-20 MED ORDER — GABAPENTIN 300 MG PO CAPS
300.0000 mg | ORAL_CAPSULE | Freq: Once | ORAL | Status: AC
  Administered 2021-03-20: 300 mg via ORAL
  Filled 2021-03-20: qty 1

## 2021-03-20 MED ORDER — ROCURONIUM BROMIDE 10 MG/ML (PF) SYRINGE
PREFILLED_SYRINGE | INTRAVENOUS | Status: AC
  Filled 2021-03-20: qty 10

## 2021-03-20 MED ORDER — 0.9 % SODIUM CHLORIDE (POUR BTL) OPTIME
TOPICAL | Status: DC | PRN
  Administered 2021-03-20: 1000 mL

## 2021-03-20 MED ORDER — FENTANYL CITRATE (PF) 100 MCG/2ML IJ SOLN
INTRAMUSCULAR | Status: AC
  Administered 2021-03-20: 100 ug via INTRAVENOUS
  Filled 2021-03-20: qty 2

## 2021-03-20 MED ORDER — FENTANYL CITRATE (PF) 100 MCG/2ML IJ SOLN
25.0000 ug | INTRAMUSCULAR | Status: DC | PRN
Start: 2021-03-20 — End: 2021-03-21

## 2021-03-20 MED ORDER — CLONIDINE HCL (ANALGESIA) 100 MCG/ML EP SOLN
EPIDURAL | Status: DC | PRN
  Administered 2021-03-20: 100 ug

## 2021-03-20 MED ORDER — TRANEXAMIC ACID-NACL 1000-0.7 MG/100ML-% IV SOLN
1000.0000 mg | INTRAVENOUS | Status: AC
  Administered 2021-03-20: 1000 mg via INTRAVENOUS
  Filled 2021-03-20: qty 100

## 2021-03-20 MED ORDER — ROCURONIUM BROMIDE 10 MG/ML (PF) SYRINGE
PREFILLED_SYRINGE | INTRAVENOUS | Status: DC | PRN
  Administered 2021-03-20: 70 mg via INTRAVENOUS

## 2021-03-20 MED ORDER — ORAL CARE MOUTH RINSE: 15.0000 mL | Freq: Once | OROMUCOSAL | Status: AC

## 2021-03-20 MED ORDER — LACTATED RINGERS IV SOLN: INTRAVENOUS | Status: DC

## 2021-03-20 MED ORDER — MIDAZOLAM HCL 2 MG/2ML IJ SOLN: 2.0000 mg | Freq: Once | INTRAMUSCULAR | Status: AC

## 2021-03-20 SURGICAL SUPPLY — 32 items
BAG COUNTER SPONGE SURGICOUNT (BAG) ×2 IMPLANT
CHLORAPREP W/TINT 26 (MISCELLANEOUS) ×2 IMPLANT
DERMABOND ADVANCED (GAUZE/BANDAGES/DRESSINGS) ×1
DERMABOND ADVANCED .7 DNX12 (GAUZE/BANDAGES/DRESSINGS) ×1 IMPLANT
DRAPE U-SHAPE 47X51 STRL (DRAPES) ×2 IMPLANT
DRSG AQUACEL AG ADV 3.5X 4 (GAUZE/BANDAGES/DRESSINGS) ×2 IMPLANT
ELECT REM PT RETURN 9FT ADLT (ELECTROSURGICAL) ×2
ELECTRODE REM PT RTRN 9FT ADLT (ELECTROSURGICAL) ×1 IMPLANT
GAUZE SPONGE 4X4 12PLY STRL (GAUZE/BANDAGES/DRESSINGS) IMPLANT
GLOVE SRG 8 PF TXTR STRL LF DI (GLOVE) ×1 IMPLANT
GLOVE SURG ORTHO LTX SZ7.5 (GLOVE) ×2 IMPLANT
GLOVE SURG UNDER POLY LF SZ8 (GLOVE) ×2
GOWN STRL REUS W/ TWL LRG LVL3 (GOWN DISPOSABLE) ×2 IMPLANT
GOWN STRL REUS W/ TWL XL LVL3 (GOWN DISPOSABLE) ×1 IMPLANT
GOWN STRL REUS W/TWL LRG LVL3 (GOWN DISPOSABLE) ×2
GOWN STRL REUS W/TWL XL LVL3 (GOWN DISPOSABLE) ×2
KIT PARS SUTURE TAPE IMPL (Miscellaneous) ×2 IMPLANT
KIT TURNOVER KIT B (KITS) ×2 IMPLANT
MANIFOLD NEPTUNE II (INSTRUMENTS) ×2 IMPLANT
NS IRRIG 1000ML POUR BTL (IV SOLUTION) ×2 IMPLANT
PACK ORTHO EXTREMITY (CUSTOM PROCEDURE TRAY) ×2 IMPLANT
PAD ARMBOARD 7.5X6 YLW CONV (MISCELLANEOUS) ×6 IMPLANT
SCRUB FOAM CHG 2% SURGICAL (MISCELLANEOUS) ×2 IMPLANT
SPONGE T-LAP 4X18 ~~LOC~~+RFID (SPONGE) ×4 IMPLANT
STRIP CLOSURE SKIN 1/2X4 (GAUZE/BANDAGES/DRESSINGS) ×2 IMPLANT
SUT MNCRL AB 3-0 PS2 27 (SUTURE) ×2 IMPLANT
SUT VIC AB 0 CT1 27 (SUTURE) ×2
SUT VIC AB 0 CT1 27XBRD ANBCTR (SUTURE) ×1 IMPLANT
TOWEL GREEN STERILE (TOWEL DISPOSABLE) ×2 IMPLANT
TUBE CONNECTING 12X1/4 (SUCTIONS) ×2 IMPLANT
WATER STERILE IRR 1000ML POUR (IV SOLUTION) ×2 IMPLANT
YANKAUER SUCT BULB TIP NO VENT (SUCTIONS) ×2 IMPLANT

## 2021-03-20 NOTE — Anesthesia Procedure Notes (Signed)
Procedure Name: Intubation Date/Time: 03/20/2021 4:53 PM Performed by: Dorthea Cove, CRNA Pre-anesthesia Checklist: Patient identified, Emergency Drugs available, Suction available and Patient being monitored Patient Re-evaluated:Patient Re-evaluated prior to induction Oxygen Delivery Method: Circle system utilized Preoxygenation: Pre-oxygenation with 100% oxygen Induction Type: IV induction Ventilation: Mask ventilation without difficulty Laryngoscope Size: Mac and 4 Grade View: Grade II Tube type: Oral Tube size: 7.5 mm Number of attempts: 1 Airway Equipment and Method: Stylet and Oral airway Placement Confirmation: ETT inserted through vocal cords under direct vision, positive ETCO2 and breath sounds checked- equal and bilateral Secured at: 23 cm Tube secured with: Tape Dental Injury: Teeth and Oropharynx as per pre-operative assessment

## 2021-03-20 NOTE — Discharge Instructions (Signed)
     Discharge Instructions    Attending Surgeon: Huel Cote, MD Office Phone Number: [phone]   Diagnosis and Procedures:    Surgeries Performed: Right achilles tendon repair  Discharge Plan:    Diet: Resume usual diet. Begin with light or bland foods.  Drink plenty of fluids.  Activity:  Touch down weight bearing, utilizing crutches, until seen at postoperative Physical Therapy visit this week. Please keep your brace locked until follow-up. You are advised to go home directly from the hospital or surgical center. Restrict your activities.  GENERAL INSTRUCTIONS: 1.  Keep your surgical site elevated above your heart for at least 5-7 days or longer to prevent swelling. This will improve your comfort and your overall recovery following surgery.     2. Please call Dr. Serena Croissant office at [phone] with questions Monday-Friday during business hours. If no one answers, please leave a message and someone should get back to the patient within 24 hours. For emergencies please call 911 or proceed to the emergency room.   3. Patient to notify surgical team if experiences any of the following: Bowel/Bladder dysfunction, uncontrolled pain, nerve/muscle weakness, incision with increased drainage or redness, nausea/vomiting and Fever greater than 101.0 F.  Be alert for signs of infection including redness, streaking, odor, fever or chills. Be alert for excessive pain or bleeding and notify your surgeon immediately.  WOUND INSTRUCTIONS:   Leave your dressing/cast/splint in place until your post operative visit.  Keep it clean and dry.  Always keep the incision clean and dry until the staples/sutures are removed. If there is no drainage from the incision you should keep it open to air. If there is drainage from the incision you must keep it covered at all times until the drainage stops  Do not soak in a bath tub, hot tub, pool, lake or other body of water until 21 days after your  surgery and your incision is completely dry and healed.  If you have removable sutures (or staples) they must be removed 10-14 days (unless otherwise instructed) from the day of your surgery.     1)  Elevate the extremity as much as possible.  2)  Keep the dressing clean and dry.  3)  Please call us if the dressing becomes wet or dirty.  4)  If you are experiencing worsening pain or worsening swelling, please call.     MEDICATIONS: Resume all previous home medications at the previous prescribed dose and frequency unless otherwise noted Start taking the  pain medications on an as-needed basis as prescribed  Please taper down pain medication over the next week following surgery.  Ideally you should not require a refill of any narcotic pain medication.  Take pain medication with food to minimize nausea. In addition to the prescribed pain medication, you may take over-the-counter pain relievers such as Tylenol.  Do NOT take additional tylenol if your pain medication already has tylenol in it.  Aspirin 325mg  daily for four weeks.      FOLLOWUP INSTRUCTIONS: 1. Follow up at the Physical Therapy Clinic 3-4 days following surgery. This appointment should be scheduled unless other arrangements have been made.The Physical Therapy scheduling number is [phone] if an appointment has not already been arranged.  2. Contact Dr. [phone] office during office hours at [phone] or the practice after hours line at [phone] for non-emergencies. For medical emergencies call 911.   Discharge Location: Home

## 2021-03-20 NOTE — Anesthesia Procedure Notes (Signed)
Anesthesia Regional Block: Adductor canal block   Pre-Anesthetic Checklist: , timeout performed,  Correct Patient, Correct Site, Correct Laterality,  Correct Procedure, Correct Position, site marked,  Risks and benefits discussed,  Surgical consent,  Pre-op evaluation,  At surgeon's request and post-op pain management  Laterality: Right  Prep: chloraprep       Needles:  Injection technique: Single-shot  Needle Type: Echogenic Needle     Needle Length: 9cm  Needle Gauge: 21     Additional Needles:   Procedures:,,,, ultrasound used (permanent image in chart),,    Narrative:  Start time: 03/20/2021 2:40 PM End time: 03/20/2021 2:44 PM Injection made incrementally with aspirations every 5 mL.  Performed by: Personally  Anesthesiologist: Cecile Hearing, MD  Additional Notes: No pain on injection. No increased resistance to injection. Injection made in 5cc increments.  Good needle visualization.  Patient tolerated procedure well.

## 2021-03-20 NOTE — Progress Notes (Signed)
Orthopedic Tech Progress Note Patient Details:  Roy Kennedy [birth year] [id number]  PACU RN called requesting a pair of CRUTCHES for patient   Ortho Devices Type of Ortho Device: Crutches Ortho Device/Splint Interventions: Ordered, Application   Post Interventions Patient Tolerated: Well Instructions Provided: Care of device  [name] 03/20/2021, 6:59 PM

## 2021-03-20 NOTE — Interval H&P Note (Signed)
History and Physical Interval Note:  03/20/2021 3:43 PM  Roy Kennedy  has presented today for surgery, with the diagnosis of RIGHT ACHILLES TAR.  The various methods of treatment have been discussed with the patient and family. After consideration of risks, benefits and other options for treatment, the patient has consented to  Procedure(s): RIGHT ACHILLES TENDON REPAIR (Right) as a surgical intervention.  The patient's history has been reviewed, patient examined, no change in status, stable for surgery.  I have reviewed the patient's chart and labs.  Questions were answered to the patient's satisfaction.     Huel Cote

## 2021-03-20 NOTE — Anesthesia Procedure Notes (Signed)
Anesthesia Regional Block: Popliteal block   Pre-Anesthetic Checklist: , timeout performed,  Correct Patient, Correct Site, Correct Laterality,  Correct Procedure, Correct Position, site marked,  Risks and benefits discussed,  Surgical consent,  Pre-op evaluation,  At surgeon's request and post-op pain management  Laterality: Right  Prep: chloraprep       Needles:  Injection technique: Single-shot  Needle Type: Echogenic Needle     Needle Length: 9cm  Needle Gauge: 21     Additional Needles:   Procedures:,,,, ultrasound used (permanent image in chart),,    Narrative:  Start time: 03/20/2021 2:34 PM End time: 03/20/2021 2:40 PM Injection made incrementally with aspirations every 5 mL.  Performed by: Personally  Anesthesiologist: Cecile Hearing, MD  Additional Notes: No pain on injection. No increased resistance to injection. Injection made in 5cc increments.  Good needle visualization.  Patient tolerated procedure well.

## 2021-03-20 NOTE — Transfer of Care (Signed)
Immediate Anesthesia Transfer of Care Note  Patient: Roy Kennedy  Procedure(s) Performed: RIGHT ACHILLES TENDON REPAIR (Right)  Patient Location: PACU  Anesthesia Type:General  Level of Consciousness: awake, alert  and oriented  Airway & Oxygen Therapy: Patient Spontanous Breathing and Patient connected to face mask oxygen  Post-op Assessment: Report given to RN and Post -op Vital signs reviewed and stable  Post vital signs: Reviewed and stable  Last Vitals:  Vitals Value Taken Time  BP 149/87 03/20/21 1806  Temp    Pulse 98 03/20/21 1806  Resp 20 03/20/21 1806  SpO2 97 % 03/20/21 1806  Vitals shown include unvalidated device data.  Last Pain:  Vitals:   03/20/21 1440  TempSrc:   PainSc: 0-No pain      Patients Stated Pain Goal: 0 (03/20/21 1430)  Complications: No notable events documented.

## 2021-03-20 NOTE — Brief Op Note (Signed)
   Brief Op Note  Date of Surgery: 03/20/2021  Preoperative Diagnosis: RIGHT ACHILLES TEAR  Postoperative Diagnosis: same  Procedure: Procedure(s): RIGHT ACHILLES TENDON REPAIR  Implants: Implant Name Type Inv. Item Serial No. Manufacturer Lot No. LRB No. Used Action  KIT PARS SUTURE TAPE IMPL - [id number] Miscellaneous KIT PARS SUTURE TAPE IMPL  Derma [id number] Right 1 Implanted    Surgeons: Surgeon(s): Vanetta Mulders, MD  Anesthesia: Regional    Estimated Blood Loss: See anesthesia record  Complications: None  Condition to PACU: Stable  Yevonne Pax, MD 03/20/2021 5:56 PM

## 2021-03-20 NOTE — Op Note (Signed)
 Date of Surgery: 03/20/2021  INDICATIONS: Mr. Roy Kennedy is a 30 y.o.-year-old male with right achilles tendon tear after jumping off a trick.  The risk and benefits of the procedure with discussed in detail and documented in the pre-operative evaluation.  PREOPERATIVE DIAGNOSIS: 1. Right complete achilles tendon tear  POSTOPERATIVE DIAGNOSIS: Same.  PROCEDURE: 1. Right achilles tendon repair  SURGEON: Steven L Bokshan MD  ASSISTANT: Kinley McCay, ATC; necessary for the timely completion of procedure and due to complexity of procedure.  ANESTHESIA:  general  IV FLUIDS AND URINE: See anesthesia record.  ANTIBIOTICS: Ancef 2g  ESTIMATED BLOOD LOSS: 15 mL.  IMPLANTS:  Implant Name Type Inv. Item Serial No. Manufacturer Lot No. LRB No. Used Action  KIT PARS SUTURE TAPE IMPL - [id number] Miscellaneous KIT PARS SUTURE TAPE IMPL  [id number] Right 1 Implanted    DRAINS: None  CULTURES: None  COMPLICATIONS: none  DESCRIPTION OF PROCEDURE:  OPERATIVE FINDINGS: Full-thickness achilles tendon tear 6cm from calcaneal insertion  OPERATIVE REPORT:    I identified the patient in the pre-operative holding area.  I marked the operative right ankle with my initials. I reviewed the risks and benefits of the proposed surgical intervention and the patient (and/or patient's guardian) wished to proceed.  Anesthesia was then performed with regional block.  The patient was transferred to the operative suite and placed in the prone position with all bony prominences padded.     SCDs were placed on the non-operative lower extremity. Appropriate antibiotics was administered within 1 hour before incision. The operative extremity was then prepped and draped in standard fashion. A time out was performed confirming the correct extremity, correct patient and correct procedure.  A 2.5cm posteromedial longitudinal incision center over the papable defect was made. Full thickness skin flaps were made.  The paratenon was cut in a longitudinal fashion and flaps created. The proximal end of the tendon was retrieved with an alise clamp. The mopped ends were debrided. A cobb was used to bluntly release adhesions proximally and good mobility was created of the proximal tendon. The Arthrex PARs device was placed through the paratenon layer up the proximal tendon. Sutures were placed through the jig and retrieved out the other end. The sutures were then collected as the device was pulled out and through the paratenon layer. Passing sutures were pulled to create a locking stitch. These sutures were protected.  The steps were repeated for the distal aspect of the tendon. A alise clamp secured the tendon and blunt dissection was made to free up adhesions. The PARs device was placed distally through the paratenon layer. The sutures were passed through the jig and retrieved. The PARs device was pulled out and sutures were retrieved out through the paratenon. Passing sutures were pulled to create a locking stitch.  Next, the foot was held in maximum plantarflexion. suture from the proximal and distal tendon stumps were tied together sequentially. Appropriate tension with approximation of the tissues was had. The wound was copiously irrigated. The paratenon was closed with 0-vicryl. The skin was closed in layers with 3-0 monocryl. The wound was dressed with steri-strips, xeroform, an aquacell with foot kept in plantarflexion.  The patient awoke from anesthesia without difficulty and was transferred to the PACU in stable condition.     Kinley McCay ATC was necessary for opening, closing, retracting, limb positioning and overall facilitation and timely completion of the procedure.     POSTOPERATIVE PLAN: he will be touchdown weight bearing in the   boot. He will be given crutches to keep weight off the leg. He will see me back in 2 weeks for wound check.  Yevonne Pax, MD 5:57 PM

## 2021-03-20 NOTE — Anesthesia Preprocedure Evaluation (Addendum)
Anesthesia Evaluation  Patient identified by MRN, date of birth, ID band Patient awake    Reviewed: Allergy & Precautions, NPO status , Patient's Chart, lab work & pertinent test results  Airway Mallampati: II  TM Distance: >3 FB Neck ROM: Full    Dental  (+) Dental Advisory Given, Chipped, Poor Dentition,    Pulmonary Current Smoker and Patient abstained from smoking.,    Pulmonary exam normal breath sounds clear to auscultation       Cardiovascular Exercise Tolerance: Good Normal cardiovascular exam Rhythm:Regular Rate:Normal     Neuro/Psych negative neurological ROS     GI/Hepatic Neg liver ROS, GERD  ,  Endo/Other  negative endocrine ROS  Renal/GU negative Renal ROS     Musculoskeletal negative musculoskeletal ROS (+)   Abdominal   Peds  (+) ADHD Hematology negative hematology ROS (+)   Anesthesia Other Findings Day of surgery medications reviewed with the patient.  Reproductive/Obstetrics                             Anesthesia Physical Anesthesia Plan  ASA: 2  Anesthesia Plan: General   Post-op Pain Management: Regional block, Tylenol PO (pre-op) and Gabapentin PO (pre-op)   Induction: Intravenous  PONV Risk Score and Plan: 2 and Midazolam, Dexamethasone and Ondansetron  Airway Management Planned: Oral ETT  Additional Equipment:   Intra-op Plan:   Post-operative Plan: Extubation in OR  Informed Consent: I have reviewed the patients History and Physical, chart, labs and discussed the procedure including the risks, benefits and alternatives for the proposed anesthesia with the patient or authorized representative who has indicated his/her understanding and acceptance.     Dental advisory given  Plan Discussed with: CRNA  Anesthesia Plan Comments:         Anesthesia Quick Evaluation

## 2021-03-21 ENCOUNTER — Encounter (HOSPITAL_COMMUNITY): Payer: Self-pay | Admitting: Orthopaedic Surgery

## 2021-03-21 NOTE — Anesthesia Postprocedure Evaluation (Addendum)
Anesthesia Post Note  Patient: Roy Kennedy  Procedure(s) Performed: RIGHT ACHILLES TENDON REPAIR (Right)     Patient location during evaluation: PACU Anesthesia Type: General and Regional Level of consciousness: awake and alert Pain management: pain level controlled Vital Signs Assessment: post-procedure vital signs reviewed and stable Respiratory status: spontaneous breathing, nonlabored ventilation, respiratory function stable and patient connected to nasal cannula oxygen Cardiovascular status: blood pressure returned to baseline and stable Postop Assessment: no apparent nausea or vomiting Anesthetic complications: no   No notable events documented.  Last Vitals:  Vitals:   03/20/21 1835 03/20/21 1850  BP: (!) 145/88 138/88  Pulse: 88 85  Resp: 13 15  Temp:  36.5 C  SpO2: 96% 98%    Last Pain:  Vitals:   03/20/21 1850  TempSrc:   PainSc: 0-No pain                 Drayk Humbarger

## 2021-03-22 ENCOUNTER — Encounter (HOSPITAL_BASED_OUTPATIENT_CLINIC_OR_DEPARTMENT_OTHER): Payer: Self-pay | Admitting: Physical Therapy

## 2021-03-23 NOTE — Therapy (Incomplete)
OUTPATIENT PHYSICAL THERAPY LOWER EXTREMITY EVALUATION   Patient Name: Roy Kennedy MRN: [id number] DOB:11/15/1990, 30 y.o., male Today's Date: 03/23/2021    Past Medical History:  Diagnosis Date   ADHD (attention deficit hyperactivity disorder)    during elementary years   GERD (gastroesophageal reflux disease)    Past Surgical History:  Procedure Laterality Date   ACHILLES TENDON SURGERY Right 03/20/2021   Procedure: RIGHT ACHILLES TENDON REPAIR;  Surgeon: Huel Cote, MD;  Location: [facility] OR;  Service: Orthopedics;  Laterality: Right;   OPEN REPAIR PERIARTICULAR FRACTURE / DISLOCATION ELBOW     Patient Active Problem List   Diagnosis Date Noted   Achilles tendon tear, right, initial encounter     PCP: Patient, No Pcp Per (Inactive)  REFERRING PROVIDER: Huel Cote, MD  REFERRING DIAG: ***  THERAPY DIAG:  No diagnosis found.  ONSET DATE: ***  SUBJECTIVE:   SUBJECTIVE STATEMENT: ***  PERTINENT HISTORY: ***  PAIN:  Are you having pain? {yes/no:20286} VAS scale: ***/10 Pain location: *** Pain orientation: {Pain Orientation:25161}  PAIN TYPE: {type:313116} Pain description: {PAIN DESCRIPTION:21022940}  Aggravating factors: *** Relieving factors: ***  PRECAUTIONS: {Therapy precautions:24002}  WEIGHT BEARING RESTRICTIONS {Yes ***/No:24003}  FALLS:  Has patient fallen in last 6 months? {yes/no:20286}, Number of falls: ***  LIVING ENVIRONMENT: Lives with: {[facility] lives with:25569::"lives with their family"} Lives in: {Lives in:25570} Stairs: {yes/no:20286}; {Stairs:24000} Has following equipment at home: {Assistive devices:23999}  OCCUPATION: ***  PLOF: {PLOF:24004}  PATIENT GOALS ***   OBJECTIVE:   DIAGNOSTIC FINDINGS: ***  PATIENT SURVEYS:  {rehab surveys:24030}  COGNITION:  Overall cognitive status: {cognition:24006}     SENSATION:  Light touch: {intact/deficits:24005}  Stereognosis: {intact/deficits:24005}  Hot/Cold:  {intact/deficits:24005}  Proprioception: {intact/deficits:24005}  MUSCLE LENGTH: Hamstrings: Right *** deg; Left *** deg Thomas test: Right *** deg; Left *** deg  POSTURE:  ***  LE AROM/PROM:  A/PROM Right 03/23/2021 Left 03/23/2021  Hip flexion    Hip extension    Hip abduction    Hip adduction    Hip internal rotation    Hip external rotation    Knee flexion    Knee extension    Ankle dorsiflexion    Ankle plantarflexion    Ankle inversion    Ankle eversion     (Blank rows = not tested)  LE MMT:  MMT Right 03/23/2021 Left 03/23/2021  Hip flexion    Hip extension    Hip abduction    Hip adduction    Hip internal rotation    Hip external rotation    Knee flexion    Knee extension    Ankle dorsiflexion    Ankle plantarflexion    Ankle inversion    Ankle eversion     (Blank rows = not tested)  LOWER EXTREMITY SPECIAL TESTS:  {LEspecialtests:26242}  JOINT MOBILITY ASSESSMENT:  ***  FUNCTIONAL TESTS:  {Functional tests:24029}  GAIT: Distance walked: *** Assistive device utilized: {Assistive devices:23999} Level of assistance: {Levels of assistance:24026} Comments: ***    TODAY'S TREATMENT: ***   PATIENT EDUCATION:  Education details: *** Person educated: {Person educated:25204} Education method: {Education Method:25205} Education comprehension: {Education Comprehension:25206}   HOME EXERCISE PROGRAM: ***  ASSESSMENT:  CLINICAL IMPRESSION: Patient is a *** y.o. *** who was seen today for physical therapy evaluation and treatment for ***. Objective impairments include {opptimpairments:25111}. These impairments are limiting patient from {activity limitations:25113}. Personal factors including {Personal factors:25162} are also affecting patient's functional outcome. Patient will benefit from skilled PT to address above impairments and improve overall function.  REHAB POTENTIAL: {rehabpotential:25112}  CLINICAL DECISION MAKING: {clinical  decision making:25114}  EVALUATION COMPLEXITY: {Evaluation complexity:25115}   GOALS: Goals reviewed with patient? {yes/no:20286}  SHORT TERM GOALS:  STG Name Target Date Goal status  1 *** Baseline:  {follow up:25551} {GOALSTATUS:25110}  2 *** Baseline:  {follow up:25551} {GOALSTATUS:25110}  3 *** Baseline: {follow up:25551} {GOALSTATUS:25110}  4 *** Baseline: {follow up:25551} {GOALSTATUS:25110}  5 *** Baseline: {follow up:25551} {GOALSTATUS:25110}  6 *** Baseline: {follow up:25551} {GOALSTATUS:25110}  7 *** Baseline: {follow up:25551} {GOALSTATUS:25110}   LONG TERM GOALS:   LTG Name Target Date Goal status  1 *** Baseline: {follow up:25551} {GOALSTATUS:25110}  2 *** Baseline: {follow up:25551} {GOALSTATUS:25110}  3 *** Baseline: {follow up:25551} {GOALSTATUS:25110}  4 *** Baseline: {follow up:25551} {GOALSTATUS:25110}  5 *** Baseline: {follow up:25551} {GOALSTATUS:25110}  6 *** Baseline: {follow up:25551} {GOALSTATUS:25110}  7 *** Baseline: {follow up:25551} {GOALSTATUS:25110}   PLAN: PT FREQUENCY: {rehab frequency:25116}  PT DURATION: {rehab duration:25117}  PLANNED INTERVENTIONS: {rehab planned interventions:25118::"Therapeutic exercises","Therapeutic activity","Neuro Muscular re-education","Balance training","Gait training","Patient/Family education","Joint mobilization"}  PLAN FOR NEXT SESSION: Zebedee Iba 03/23/2021, 5:47 PM

## 2021-03-24 ENCOUNTER — Telehealth (HOSPITAL_BASED_OUTPATIENT_CLINIC_OR_DEPARTMENT_OTHER): Payer: Self-pay | Admitting: Orthopaedic Surgery

## 2021-03-24 ENCOUNTER — Ambulatory Visit (HOSPITAL_BASED_OUTPATIENT_CLINIC_OR_DEPARTMENT_OTHER): Payer: BC Managed Care – PPO | Attending: Orthopaedic Surgery | Admitting: Physical Therapy

## 2021-03-24 DIAGNOSIS — R600 Localized edema: Secondary | ICD-10-CM | POA: Insufficient documentation

## 2021-03-24 DIAGNOSIS — M25672 Stiffness of left ankle, not elsewhere classified: Secondary | ICD-10-CM | POA: Insufficient documentation

## 2021-03-24 DIAGNOSIS — R2689 Other abnormalities of gait and mobility: Secondary | ICD-10-CM | POA: Insufficient documentation

## 2021-03-24 DIAGNOSIS — M25572 Pain in left ankle and joints of left foot: Secondary | ICD-10-CM | POA: Insufficient documentation

## 2021-03-24 NOTE — Telephone Encounter (Signed)
Pt called asking if he would need a knee scooter for 3 months. I informed him he would not need one that long, per protocol

## 2021-03-24 NOTE — Telephone Encounter (Signed)
Pt called and asked about post op dressing changes. I informed him the Aquacell can stay on until his post op appt and if he wishes, he can tape the edges of the Aquacell pad if he is concerned for the bandage lifting

## 2021-03-28 ENCOUNTER — Ambulatory Visit (HOSPITAL_BASED_OUTPATIENT_CLINIC_OR_DEPARTMENT_OTHER): Payer: BC Managed Care – PPO | Admitting: Physical Therapy

## 2021-03-28 ENCOUNTER — Encounter (HOSPITAL_BASED_OUTPATIENT_CLINIC_OR_DEPARTMENT_OTHER): Payer: Self-pay | Admitting: Physical Therapy

## 2021-03-28 ENCOUNTER — Other Ambulatory Visit: Payer: Self-pay

## 2021-03-28 DIAGNOSIS — M25672 Stiffness of left ankle, not elsewhere classified: Secondary | ICD-10-CM | POA: Diagnosis present

## 2021-03-28 DIAGNOSIS — R2689 Other abnormalities of gait and mobility: Secondary | ICD-10-CM

## 2021-03-28 DIAGNOSIS — M25572 Pain in left ankle and joints of left foot: Secondary | ICD-10-CM | POA: Diagnosis not present

## 2021-03-28 DIAGNOSIS — R600 Localized edema: Secondary | ICD-10-CM | POA: Diagnosis present

## 2021-03-28 NOTE — Therapy (Signed)
OUTPATIENT PHYSICAL THERAPY LOWER EXTREMITY EVALUATION   Patient Name: Roy Kennedy MRN: [id number] DOB:Apr 14, 1991, 30 y.o., male Today's Date: 03/28/2021    Past Medical History:  Diagnosis Date   ADHD (attention deficit hyperactivity disorder)    during elementary years   GERD (gastroesophageal reflux disease)    Past Surgical History:  Procedure Laterality Date   ACHILLES TENDON SURGERY Right 03/20/2021   Procedure: RIGHT ACHILLES TENDON REPAIR;  Surgeon: Huel Cote, MD;  Location: [facility] OR;  Service: Orthopedics;  Laterality: Right;   OPEN REPAIR PERIARTICULAR FRACTURE / DISLOCATION ELBOW     Patient Active Problem List   Diagnosis Date Noted   Achilles tendon tear, right, initial encounter     PCP: Patient, No Pcp Per (Inactive)  REFERRING PROVIDER: Huel Cote, MD  REFERRING DIAG: M76.61 (ICD-10-CM) - Achilles tendinitis, right leg  THERAPY DIAG:  Right achilles tear   ONSET DATE: 03/20/2021  SUBJECTIVE:   SUBJECTIVE STATEMENT: Patient jumped off a box truck and felt a pop on 11/28. He was found to have a torn right achilles tendon. He had it repaired on 12/5. He had significant pain the first few days but it has improved now. He was instructed byt the MD that he can be TDWB with the crutches.   PERTINENT HISTORY: ADHD  PAIN:  Are you having pain? Yes VAS scale: 3/10 Pain location:  Pain orientation: Right  PAIN TYPE: aching Pain description: constant  Aggravating factors: up and moving around  Relieving factors: not putting pain on it   PRECAUTIONS: Other: achilles   WEIGHT BEARING RESTRICTIONS Yes NWB for 2 weeks per protocol then protected weight bearing for 2 weeks  FALLS:  Has patient fallen in last 6 months? 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: [employer]   PLOF: Independent  PATIENT GOALS   To return to  prior level of function    OBJECTIVE:   DIAGNOSTIC FINDINGS:  Nothing post-op   PATIENT SURVEYS:  FOTO 27% intake expected 78 in 17 visits   COGNITION:  Overall cognitive status: Within functional limits for tasks assessed     SENSATION:  Light touch: Appears intact  Stereognosis: Appears intact  Hot/Cold: Appears intact  Proprioception: Appears intact Mild numbness around the surgical area and the toes  POSTURE:  good  LE AROM/PROM:  PROM Right 03/28/2021 Left 03/28/2021  Hip flexion    Hip extension    Hip abduction    Hip adduction    Hip internal rotation    Hip external rotation    Knee flexion    Knee extension    Ankle dorsiflexion  3  Ankle plantarflexion  15 not pushed to end range    Ankle inversion  No pain limited movement   Ankle eversion  Pain in inferior lateralmalleolus    (Blank rows = not tested) Active DF to neutral  Active eversion caused pain around the peroneals   LE MMT:  MMT Right 03/28/2021 Left 03/28/2021  Hip flexion    Hip extension    Hip abduction    Hip adduction    Hip internal rotation    Hip external rotation    Knee flexion    Knee extension    Ankle dorsiflexion    Ankle plantarflexion    Ankle inversion    Ankle eversion     (Blank rows = not tested)  Not tested today 2nd to recent surgery       GAIT: Using crutches with TDWB. Patient reports if he does more then TDWB he gets pain in the inferior aspect of his lateral maleolus   Palpation: Pain around the inferior portion of the maleolus otherwise nothing unexpected.   Edema:  Figure 8 right: 56.5 Left 54.5   TODAY'S TREATMENT: Access Code: [email or web address] URL: [email or web address] Date: 03/29/2021 Prepared by: Lorayne Bender  Exercises Supine Straight Leg Raises - 3 x daily - 7 x weekly - 3 sets - 10 reps Sidelying Hip Abduction - 3 x daily - 7 x weekly - 3 sets - 10 reps Long Sitting Ankle Pumps - 3 x daily - 7 x weekly - 3 sets - 10  reps Supine Ankle Inversion Eversion AROM - 3 x daily - 7 x weekly - 3 sets - 10 reps    PATIENT EDUCATION:  Education details: reviewed HEP and symptom management; importance of following weight bearing precautions  Person educated: Patient Education method: Explanation, Demonstration, Tactile cues, and Verbal cues Education comprehension: verbalized understanding, returned demonstration, verbal cues required, tactile cues required, and needs further education   HOME EXERCISE PROGRAM: Access Code: [email or web address] URL: [email or web address] Date: 03/29/2021 Prepared by: Lorayne Bender  Exercises Supine Straight Leg Raises - 3 x daily - 7 x weekly - 3 sets - 10 reps Sidelying Hip Abduction - 3 x daily - 7 x weekly - 3 sets - 10 reps Long Sitting Ankle Pumps - 3 x daily - 7 x weekly - 3 sets - 10 reps Supine Ankle Inversion Eversion AROM - 3 x daily - 7 x weekly - 3 sets - 10 reps   ASSESSMENT:  CLINICAL IMPRESSION: Patient is a 30 year old male S/P left achillies repair on 03/20/2021. He is currently doing very well. He has better then expected movement into DF. He was able to go 3 degrees past neutral without end range over pressure. He was advised not to go past neutral with his ankle pumps to allow for healing. He has mild edema at this time. We will progress him per protocol and MD recommendations off the crutches. He would benefit from skilled therapy to return to his prior level of function.    Objective impairments include Abnormal gait, decreased activity tolerance, decreased balance, decreased endurance, decreased mobility, difficulty walking, decreased ROM, decreased strength, impaired sensation, and pain. These impairments are limiting patient from driving, occupation, laundry, yard work, and shopping.No personal factors including     are also affecting patient's functional outcome. Patient will benefit from skilled PT to address above impairments and improve overall  function.  REHAB POTENTIAL: Good  CLINICAL DECISION MAKING: Stable/uncomplicated  EVALUATION COMPLEXITY: Low   GOALS: Goals reviewed with patient? No  SHORT TERM GOALS:  STG Name Target Date Goal status  1 Patient will increase passive DF to 10 degrees per protocol  Baseline:  04/26/2021 INITIAL  2 Patient will ambulate 200' without crutches in boot  Baseline:  04/26/2021 INITIAL  3 Patient will be independent with basic left lower extremity strengthening program  Baseline: 04/26/2021 INITIAL  LONG TERM GOALS:   LTG Name Target Date Goal status  1 Patient will ambualte 3000' without boot and without increase pain in order to perform IADL's  Baseline: 05/23/2021 INITIAL  2 Patient will go up and down 12 steps without pain in order to progress towards return to work  Baseline: 05/23/2021 INITIAL  3 Patient will demonstrate a 78% ability on  FOTO in order to demonstrate improved self perceived function   Baseline: 05/23/2021 INITIAL  PLAN: PT FREQUENCY: 2x/week  PT DURATION: 8 weeks  PLANNED INTERVENTIONS: Therapeutic exercises, Therapeutic activity, Neuro Muscular re-education, Balance training, Gait training, Patient/Family education, Joint mobilization, Aquatic Therapy, Dry Needling, Electrical stimulation, Cryotherapy, Moist heat, Taping, Ultrasound, and Manual therapy  PLAN FOR NEXT SESSION: continue to progress per protocol. Monitor motion. Patient only requires 1x a week 2nd to reaching protocol limits early. Progress weight bearing as tolerated. Patient is one week post-op.    Dessie Coma PT DPT  03/28/2021, 4:10 PM

## 2021-03-29 ENCOUNTER — Encounter (HOSPITAL_BASED_OUTPATIENT_CLINIC_OR_DEPARTMENT_OTHER): Payer: Self-pay | Admitting: Physical Therapy

## 2021-03-29 ENCOUNTER — Telehealth (HOSPITAL_BASED_OUTPATIENT_CLINIC_OR_DEPARTMENT_OTHER): Payer: Self-pay | Admitting: Orthopaedic Surgery

## 2021-03-31 ENCOUNTER — Other Ambulatory Visit (HOSPITAL_BASED_OUTPATIENT_CLINIC_OR_DEPARTMENT_OTHER): Payer: Self-pay | Admitting: Orthopaedic Surgery

## 2021-03-31 ENCOUNTER — Other Ambulatory Visit (HOSPITAL_BASED_OUTPATIENT_CLINIC_OR_DEPARTMENT_OTHER): Payer: Self-pay

## 2021-03-31 MED ORDER — OXYCODONE HCL 5 MG PO TABS
5.0000 mg | ORAL_TABLET | ORAL | 0 refills | Status: AC | PRN
  Filled 2021-03-31: qty 20, 4d supply, fill #0

## 2021-03-31 NOTE — Telephone Encounter (Signed)
Rx sent 

## 2021-04-03 ENCOUNTER — Other Ambulatory Visit: Payer: Self-pay

## 2021-04-03 ENCOUNTER — Ambulatory Visit (INDEPENDENT_AMBULATORY_CARE_PROVIDER_SITE_OTHER): Payer: BC Managed Care – PPO | Admitting: Orthopaedic Surgery

## 2021-04-03 ENCOUNTER — Other Ambulatory Visit (HOSPITAL_BASED_OUTPATIENT_CLINIC_OR_DEPARTMENT_OTHER): Payer: Self-pay

## 2021-04-03 DIAGNOSIS — M7661 Achilles tendinitis, right leg: Secondary | ICD-10-CM

## 2021-04-03 MED ORDER — IBUPROFEN 800 MG PO TABS
800.0000 mg | ORAL_TABLET | Freq: Three times a day (TID) | ORAL | 0 refills | Status: AC
  Filled 2021-04-03: qty 30, 10d supply, fill #0

## 2021-04-03 MED ORDER — METHOCARBAMOL 500 MG PO TABS
500.0000 mg | ORAL_TABLET | Freq: Four times a day (QID) | ORAL | 0 refills | Status: AC
  Filled 2021-04-03: qty 30, 8d supply, fill #0

## 2021-04-03 MED ORDER — ACETAMINOPHEN 500 MG PO TABS
500.0000 mg | ORAL_TABLET | Freq: Three times a day (TID) | ORAL | 0 refills | Status: AC
  Filled 2021-04-03: qty 30, 10d supply, fill #0

## 2021-04-03 MED ORDER — ASPIRIN EC 325 MG PO TBEC
325.0000 mg | DELAYED_RELEASE_TABLET | Freq: Every day | ORAL | 0 refills | Status: AC
  Filled 2021-04-03: qty 14, 14d supply, fill #0

## 2021-04-03 NOTE — Addendum Note (Signed)
Addended by: Benancio Deeds on: 04/03/2021 10:21 AM   Modules accepted: Orders

## 2021-04-03 NOTE — Progress Notes (Signed)
Post Operative Evaluation    Procedure/Date of Surgery: Right Achilles tendon repair March 20, 2021  Interval History:    Presents today 2 weeks status post the above procedure.  He is using crutches and a cam boot to keep weight off of the right foot.  He has begun physical therapy at this point.  Overall his pain is better controlled.  He does endorse some numbness at the plantar aspect of all of his toes.   PMH/PSH/Family History/Social History/Meds/Allergies:    Past Medical History:  Diagnosis Date   ADHD (attention deficit hyperactivity disorder)    during elementary years   GERD (gastroesophageal reflux disease)    Past Surgical History:  Procedure Laterality Date   ACHILLES TENDON SURGERY Right 03/20/2021   Procedure: RIGHT ACHILLES TENDON REPAIR;  Surgeon: Huel Cote, MD;  Location: [facility] OR;  Service: Orthopedics;  Laterality: Right;   OPEN REPAIR PERIARTICULAR FRACTURE / DISLOCATION ELBOW     Social History   Socioeconomic History   Marital status: Single    Spouse name: Not on file   Number of children: Not on file   Years of education: Not on file   Highest education level: Not on file  Occupational History   Not on file  Tobacco Use   Smoking status: Some Days    Types: Cigarettes, Cigars   Smokeless tobacco: Never  Vaping Use   Vaping Use: Never used  Substance and Sexual Activity   Alcohol use: Yes    Comment: rarely   Drug use: Not Currently   Sexual activity: Not on file  Other Topics Concern   Not on file  Social History Narrative   Not on file   Social Determinants of Health   Financial Resource Strain: Not on file  Food Insecurity: Not on file  Transportation Needs: Not on file  Physical Activity: Not on file  Stress: Not on file  Social Connections: Not on file   Family History  Problem Relation Age of Onset   Hypertension Mother    No Known Allergies Current Outpatient Medications   Medication Sig Dispense Refill   aspirin EC 325 MG tablet Take 1 tablet (325 mg total) by mouth daily. 30 tablet 0   cyclobenzaprine (FLEXERIL) 10 MG tablet Take 1 tablet (10 mg total) by mouth 2 (two) times daily as needed for muscle spasms. 20 tablet 0   ibuprofen (ADVIL) 600 MG tablet Take 1 tablet (600 mg total) by mouth every 6 (six) hours as needed for moderate pain. 30 tablet 0   oxyCODONE (OXY IR/ROXICODONE) 5 MG immediate release tablet Take 1 tablet (5 mg total) by mouth every 4 (four) hours as needed (severe pain). 20 tablet 0   No current facility-administered medications for this visit.   No results found.  Review of Systems:   A ROS was performed including pertinent positives and negatives as documented in the HPI.   Musculoskeletal Exam:    There were no vitals taken for this visit.  Incision is well-healing.  No palpable gap about the Achilles.  He has active dorsiflexion to 20 degrees with minimal swelling  Imaging:    None  I personally reviewed and interpreted the radiographs.   Assessment:   30 year old male 2-week status post right Achilles tendon repair overall doing well.  We will  continue to advance per protocol.  He may begin weightbearing at this time  Plan :    -He will see me back in 4 weeks      I personally saw and evaluated the patient, and participated in the management and treatment plan.  Huel Cote, MD Attending Physician, Orthopedic Surgery  This document was dictated using Dragon voice recognition software. A reasonable attempt at proof reading has been made to minimize errors.

## 2021-04-05 ENCOUNTER — Other Ambulatory Visit: Payer: Self-pay

## 2021-04-05 ENCOUNTER — Encounter (HOSPITAL_BASED_OUTPATIENT_CLINIC_OR_DEPARTMENT_OTHER): Payer: Self-pay | Admitting: Physical Therapy

## 2021-04-05 ENCOUNTER — Ambulatory Visit (HOSPITAL_BASED_OUTPATIENT_CLINIC_OR_DEPARTMENT_OTHER): Payer: BC Managed Care – PPO | Admitting: Physical Therapy

## 2021-04-05 DIAGNOSIS — M25672 Stiffness of left ankle, not elsewhere classified: Secondary | ICD-10-CM

## 2021-04-05 DIAGNOSIS — R600 Localized edema: Secondary | ICD-10-CM

## 2021-04-05 DIAGNOSIS — M25572 Pain in left ankle and joints of left foot: Secondary | ICD-10-CM

## 2021-04-05 NOTE — Therapy (Signed)
OUTPATIENT PHYSICAL THERAPY TREATMENT   Patient Name: Roy Kennedy MRN: [id number] DOB:10-07-1990, 30 y.o., male Today's Date: 04/05/2021   PT End of Session - 04/05/21 1440     Visit Number 2    Number of Visits 16    Date for PT Re-Evaluation 05/24/21    Authorization Type BCBS    PT Start Time 1435    PT Stop Time 1510    PT Time Calculation (min) 35 min    Activity Tolerance Patient tolerated treatment well    Behavior During Therapy WFL for tasks assessed/performed             Past Medical History:  Diagnosis Date   ADHD (attention deficit hyperactivity disorder)    during elementary years   GERD (gastroesophageal reflux disease)    Past Surgical History:  Procedure Laterality Date   ACHILLES TENDON SURGERY Right 03/20/2021   Procedure: RIGHT ACHILLES TENDON REPAIR;  Surgeon: Huel Cote, MD;  Location: [facility] OR;  Service: Orthopedics;  Laterality: Right;   OPEN REPAIR PERIARTICULAR FRACTURE / DISLOCATION ELBOW     Patient Active Problem List   Diagnosis Date Noted   Achilles tendon tear, right, initial encounter     PCP: Patient, No Pcp Per (Inactive)  REFERRING PROVIDER: Huel Cote, MD  REFERRING DIAG: M76.61 (ICD-10-CM) - Achilles tendinitis, right leg  THERAPY DIAG:  Right achilles tear   ONSET DATE: 03/20/2021  SUBJECTIVE:   SUBJECTIVE STATEMENT: Pt states he has still mild swelling with the ankle still. He states he still has NT into the bottom of the foot.   PERTINENT HISTORY: ADHD  PAIN:  Are you having pain? 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: [employer]   PLOF: Independent  PATIENT GOALS   To return to prior level of function    OBJECTIVE:   PATIENT SURVEYS:  FOTO 27% intake expected 78 in 17 visits    TODAY'S TREATMENT:  Ankle PROM to tolerance into IV and EV  Exercises Supine Straight Leg Raises - 3 sets - 10 reps Sidelying Hip Abduction 3lbs 3 sets - 10 reps Long Sitting Ankle Pumps - 10x Supine Ankle Inversion Eversion AROM - 10x  Gait training PWB with CAM Boot and crutches Stair management technique/safety/sequencing   PATIENT EDUCATION:  Education details: anatomy, exercise progression, DOMS expectations, muscle firing,  envelope of function, HEP, POC  Person educated: Patient Education method: Explanation, Demonstration, Tactile cues, and Verbal cues Education comprehension: verbalized understanding, returned demonstration, verbal cues required, tactile cues required, and needs further education   HOME EXERCISE PROGRAM: Access Code: [email or web address] URL: [email or web address] Date: 03/29/2021 Prepared by: Lorayne Bender  ASSESSMENT:  CLINICAL IMPRESSION: Pt able to progress with WB per last MD note. Pt progressed to [facility] at this time with bilateral crutches and CAM boot. Pt advised to feel about 25% WB in order to protect the repair. Pt advised to continue with UE and contralateral limb gym based exercises in order to utilize preservation effect and maintain current levels of fitness. Pt advised to do only quad and glute based strength on R LE. No HS curls at  this time on weight machines due to risk of excessive DF with gastroc contraction. Plan to introduce gentle ankle EV and IV mobs at next session due to calcaneal stiffness.    Objective impairments include Abnormal gait, decreased activity tolerance, decreased balance, decreased endurance, decreased mobility, difficulty walking, decreased ROM, decreased strength, impaired sensation, and pain. These impairments are limiting  patient from driving, occupation, laundry, yard work, and shopping.No personal factors including     are also affecting patient's functional outcome. Patient will benefit from skilled PT to address above impairments and improve overall function.  REHAB POTENTIAL: Good  CLINICAL DECISION MAKING: Stable/uncomplicated  EVALUATION COMPLEXITY: Low   GOALS: Goals reviewed with patient? No  SHORT TERM GOALS:  STG Name Target Date Goal status  1 Patient will increase passive DF to 10 degrees per protocol  Baseline:  05/03/2021 INITIAL  2 Patient will ambulate 200' without crutches in boot  Baseline:  05/03/2021 INITIAL  3 Patient will be independent with basic left lower extremity strengthening program  Baseline: 04/26/2021 INITIAL  LONG TERM GOALS:   LTG Name Target Date Goal status  1 Patient will ambualte 3000' without boot and without increase pain in order to perform IADL's  Baseline: 05/31/2021 INITIAL  2 Patient will go up and down 12 steps without pain in order to progress towards return to work  Baseline: 05/31/2021 INITIAL  3 Patient will demonstrate a 78% ability on FOTO in order to demonstrate improved self perceived function   Baseline: 05/31/2021 INITIAL  PLAN: PT FREQUENCY: 2x/week  PT DURATION: 8 weeks  PLANNED INTERVENTIONS: Therapeutic exercises, Therapeutic activity, Neuro Muscular re-education, Balance training, Gait training, Patient/Family education, Joint mobilization, Aquatic Therapy, Dry Needling, Electrical stimulation, Cryotherapy, Moist heat, Taping, Ultrasound, and Manual therapy  PLAN FOR NEXT SESSION: continue to progress per protocol. Monitor motion. Patient only requires 1x a week 2nd to reaching protocol limits early. Progress weight bearing as tolerated. Patient is one week post-op.    Zebedee Iba PT DPT  04/05/2021, 3:22 PM

## 2021-04-12 ENCOUNTER — Encounter (HOSPITAL_BASED_OUTPATIENT_CLINIC_OR_DEPARTMENT_OTHER): Payer: Self-pay | Admitting: Physical Therapy

## 2021-04-12 ENCOUNTER — Ambulatory Visit (HOSPITAL_BASED_OUTPATIENT_CLINIC_OR_DEPARTMENT_OTHER): Payer: BC Managed Care – PPO | Admitting: Physical Therapy

## 2021-04-12 ENCOUNTER — Other Ambulatory Visit: Payer: Self-pay

## 2021-04-12 DIAGNOSIS — M25572 Pain in left ankle and joints of left foot: Secondary | ICD-10-CM | POA: Diagnosis not present

## 2021-04-12 DIAGNOSIS — R2689 Other abnormalities of gait and mobility: Secondary | ICD-10-CM

## 2021-04-12 DIAGNOSIS — R600 Localized edema: Secondary | ICD-10-CM

## 2021-04-12 DIAGNOSIS — M25672 Stiffness of left ankle, not elsewhere classified: Secondary | ICD-10-CM

## 2021-04-12 NOTE — Therapy (Signed)
OUTPATIENT PHYSICAL THERAPY TREATMENT   Patient Name: Roy Kennedy MRN: [id number] DOB:[birth year], 30 y.o., male Today's Date: 04/12/2021   PT End of Session - 04/12/21 1549     Visit Number 3    Number of Visits 16    Date for PT Re-Evaluation 05/24/21    Authorization Type BCBS    PT Start Time 1517    PT Stop Time 1557    PT Time Calculation (min) 40 min    Activity Tolerance Patient tolerated treatment well    Behavior During Therapy WFL for tasks assessed/performed              Past Medical History:  Diagnosis Date   ADHD (attention deficit hyperactivity disorder)    during elementary years   GERD (gastroesophageal reflux disease)    Past Surgical History:  Procedure Laterality Date   ACHILLES TENDON SURGERY Right 03/20/2021   Procedure: RIGHT ACHILLES TENDON REPAIR;  Surgeon: Huel Cote, MD;  Location: [facility] OR;  Service: Orthopedics;  Laterality: Right;   OPEN REPAIR PERIARTICULAR FRACTURE / DISLOCATION ELBOW     Patient Active Problem List   Diagnosis Date Noted   Achilles tendon tear, right, initial encounter     PCP: Patient, No Pcp Per (Inactive)  REFERRING PROVIDER: Huel Cote, MD  REFERRING DIAG: M76.61 (ICD-10-CM) - Achilles tendinitis, right leg  THERAPY DIAG:  Right achilles tear   ONSET DATE: 03/20/2021  SUBJECTIVE:   SUBJECTIVE STATEMENT: Pt states he still has some numbness into to the bottom of the foot. The morning pain is still very intense.   PERTINENT HISTORY: ADHD  PAIN:  Are you having pain? 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[employer]   PLOF: Independent  PATIENT GOALS   To return to prior level of function    OBJECTIVE:   PATIENT SURVEYS:  FOTO 27% intake expected 78 in 17 visits    TODAY'S TREATMENT:  Ankle PROM to tolerance into IV and EV   Exercises Supine Straight Leg Raises - 2 sets - 10 reps Sidelying Hip Abduction 3lbs 3 sets - 10 reps Long Sitting Ankle Pumps - 10x Supine Ankle Inversion Eversion AROM - 20x Ankle Circles 20x BAPS board 20x in all directions  Gait training PWB with CAM Boot and crutches Stair management technique/safety/sequencing   PATIENT EDUCATION:  Education details: anatomy, exercise progression, DOMS expectations, muscle firing,  envelope of function, HEP, POC  Person educated: Patient Education method: Explanation, Demonstration, Tactile cues, and Verbal cues Education comprehension: verbalized understanding, returned demonstration, verbal cues required, tactile cues required, and needs further education   HOME EXERCISE PROGRAM: Access Code: [email or web address] URL: [email or web address] Date: 03/29/2021 Prepared by: Lorayne Bender  ASSESSMENT:  CLINICAL IMPRESSION: Pt with continued ankle stiffness and lateral ankle tightness. Pt did not respond favorably to manual STM to fibularis group but was able to improve ankle IV and EV ROM today after manual. Pt progressed to about 25-50% WB in order to protect the repair. Pt demonstrates motor control deficits with BAPS board, especially into ant lateral corner. Plan to continue with progressing WB and  gait per protocol. Pt appears apprehensive with increased WB at this time, likely need continued encouragement   Objective impairments include Abnormal gait, decreased activity tolerance, decreased balance, decreased endurance, decreased mobility, difficulty walking, decreased ROM, decreased strength, impaired sensation, and pain. These impairments are limiting patient from driving,  occupation, laundry, yard work, and shopping.No personal factors including     are also affecting patient's functional outcome. Patient will benefit from skilled PT to address above impairments and improve overall function.  REHAB POTENTIAL: Good  CLINICAL DECISION MAKING: Stable/uncomplicated  EVALUATION COMPLEXITY: Low   GOALS: Goals reviewed with patient? No  SHORT TERM GOALS:  STG Name Target Date Goal status  1 Patient will increase passive DF to 10 degrees per protocol  Baseline:  05/10/2021 INITIAL  2 Patient will ambulate 200' without crutches in boot  Baseline:  05/10/2021 INITIAL  3 Patient will be independent with basic left lower extremity strengthening program  Baseline: 04/26/2021 INITIAL  LONG TERM GOALS:   LTG Name Target Date Goal status  1 Patient will ambualte 3000' without boot and without increase pain in order to perform IADL's  Baseline: 06/07/2021 INITIAL  2 Patient will go up and down 12 steps without pain in order to progress towards return to work  Baseline: 06/07/2021 INITIAL  3 Patient will demonstrate a 78% ability on FOTO in order to demonstrate improved self perceived function   Baseline: 06/07/2021 INITIAL  PLAN: PT FREQUENCY: 2x/week  PT DURATION: 8 weeks  PLANNED INTERVENTIONS: Therapeutic exercises, Therapeutic activity, Neuro Muscular re-education, Balance training, Gait training, Patient/Family education, Joint mobilization, Aquatic Therapy, Dry Needling, Electrical stimulation, Cryotherapy, Moist heat, Taping, Ultrasound, and Manual therapy  PLAN FOR NEXT SESSION: continue to progress per protocol. Monitor motion. Patient only requires 1x a week 2nd to reaching protocol limits early. Progress weight bearing as tolerated. Patient is one week post-op.    Zebedee Iba PT DPT  04/12/2021, 3:58 PM

## 2021-04-18 ENCOUNTER — Other Ambulatory Visit (HOSPITAL_BASED_OUTPATIENT_CLINIC_OR_DEPARTMENT_OTHER): Payer: Self-pay | Admitting: Orthopaedic Surgery

## 2021-04-18 ENCOUNTER — Telehealth (HOSPITAL_BASED_OUTPATIENT_CLINIC_OR_DEPARTMENT_OTHER): Payer: Self-pay | Admitting: Orthopaedic Surgery

## 2021-04-18 MED ORDER — MELOXICAM 15 MG PO TABS: 15.0000 mg | ORAL_TABLET | Freq: Every day | ORAL | 0 refills | Status: DC

## 2021-04-18 NOTE — Telephone Encounter (Signed)
Patient called about pain meds. Said he spoke to Dr. Steward Drone earlier and he said he was going to send Rx to [facility] on [address]

## 2021-04-20 ENCOUNTER — Other Ambulatory Visit: Payer: Self-pay

## 2021-04-20 ENCOUNTER — Ambulatory Visit (HOSPITAL_BASED_OUTPATIENT_CLINIC_OR_DEPARTMENT_OTHER): Payer: BC Managed Care – PPO | Attending: Orthopaedic Surgery | Admitting: Physical Therapy

## 2021-04-20 DIAGNOSIS — R600 Localized edema: Secondary | ICD-10-CM | POA: Insufficient documentation

## 2021-04-20 DIAGNOSIS — R2689 Other abnormalities of gait and mobility: Secondary | ICD-10-CM | POA: Insufficient documentation

## 2021-04-20 DIAGNOSIS — M25672 Stiffness of left ankle, not elsewhere classified: Secondary | ICD-10-CM | POA: Insufficient documentation

## 2021-04-20 DIAGNOSIS — M25572 Pain in left ankle and joints of left foot: Secondary | ICD-10-CM | POA: Insufficient documentation

## 2021-04-20 NOTE — Therapy (Addendum)
OUTPATIENT PHYSICAL THERAPY TREATMENT NOTE   Patient Name: Roy Kennedy MRN: [id number] DOB:[birth year], 31 y.o., male Today's Date: 04/21/2021  PCP: Patient, No Pcp Per (Inactive) REFERRING PROVIDER: Huel Cote, MD   PT End of Session - 04/21/21 1518     Visit Number 4    Number of Visits 16    Date for PT Re-Evaluation 05/24/21    Authorization Type BCBS    PT Start Time 1320   Pateint 5 min late   PT Stop Time 1400    PT Time Calculation (min) 40 min    Activity Tolerance Patient tolerated treatment well    Behavior During Therapy WFL for tasks assessed/performed             Past Medical History:  Diagnosis Date   ADHD (attention deficit hyperactivity disorder)    during elementary years   GERD (gastroesophageal reflux disease)    Past Surgical History:  Procedure Laterality Date   ACHILLES TENDON SURGERY Right 03/20/2021   Procedure: RIGHT ACHILLES TENDON REPAIR;  Surgeon: Huel Cote, MD;  Location: [facility] OR;  Service: Orthopedics;  Laterality: Right;   OPEN REPAIR PERIARTICULAR FRACTURE / DISLOCATION ELBOW     Patient Active Problem List   Diagnosis Date Noted   Achilles tendon tear, right, initial encounter      PCP: Patient, No Pcp Per (Inactive)   REFERRING PROVIDER: Huel Cote, MD   REFERRING DIAG: M76.61 (ICD-10-CM) - Achilles tendinitis, right leg   THERAPY DIAG:  Right achilles tear    ONSET DATE: 03/20/2021   SUBJECTIVE:    SUBJECTIVE STATEMENT: Patient reports he has had no pain in his achilles area but has had pain in his lateral foot. He has been putting more weight on his foot walking. He feels like that may play a role.    PERTINENT HISTORY: ADHD   PAIN:  1/6 Are you having pain? 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: [employer]    PLOF: Independent   PATIENT GOALS    To return to prior level of function      OBJECTIVE:    PATIENT SURVEYS:  FOTO 27% intake expected 78 in 17 visits      TODAY'S TREATMENT:  1/6 Supine Straight Leg Raises - 2 sets - 10 reps Sidelying Hip Abduction 3lbs 3 sets - 10 reps Porne hip extension 2x10  SAQ 2lb 2x15  LAQ 2lb 2x15  BAPS board 20x in all directions  Gait: Reviewed use of 1 crutch in clinic; cuing for posture PROM into flexion but reached protocol limit quickly  Manual therapy to the calf     12/28  Ankle PROM to tolerance into IV and EV     Exercises Supine Straight Leg Raises - 2 sets - 10 reps Sidelying Hip Abduction 3lbs 3 sets - 10 reps Long Sitting Ankle Pumps - 10x Supine Ankle Inversion Eversion AROM - 20x Ankle Circles 20x BAPS board 20x in all directions   Gait training PWB with CAM Boot and crutches Stair management technique/safety/sequencing     PATIENT EDUCATION:  Education  details: anatomy, exercise progression, DOMS expectations, muscle firing,  envelope of function, HEP, POC   Person educated: Patient Education method: Explanation, Demonstration, Tactile cues, and Verbal cues Education comprehension: verbalized understanding, returned demonstration, verbal cues required, tactile cues required, and needs further education     HOME EXERCISE PROGRAM: Access Code: [email or web address] URL: [email or web address] Date: 03/29/2021 Prepared by: Lorayne Bender   ASSESSMENT:   CLINICAL IMPRESSION: Patients DF is progressing well. He continues to have pain in his lateral ankle. He reports this pain increases with activity. Therapy kept his exercises consistent today. We did add in prone hip extension to his HEP. We will  continue to progress as tolerated.     Objective impairments include Abnormal gait, decreased activity tolerance, decreased balance, decreased endurance, decreased mobility, difficulty walking, decreased ROM, decreased strength, impaired sensation, and pain. These impairments are limiting patient from driving, occupation, laundry, yard work, and shopping.No personal factors including     are also affecting patient's functional outcome. Patient will benefit from skilled PT to address above impairments and improve overall function.   REHAB POTENTIAL: Good   CLINICAL DECISION MAKING: Stable/uncomplicated   EVALUATION COMPLEXITY: Low     GOALS: Goals reviewed with patient? No   SHORT TERM GOALS:   STG Name Target Date Goal status  1 Patient will increase passive DF to 10 degrees per protocol  Baseline:  05/10/2021 INITIAL  2 Patient will ambulate 200' without crutches in boot  Baseline:  05/10/2021 INITIAL  3 Patient will be independent with basic left lower extremity strengthening program  Baseline: 04/26/2021 INITIAL  LONG TERM GOALS:    LTG Name Target Date Goal status  1 Patient will ambualte 3000' without boot and without increase pain in order to perform IADL's  Baseline: 06/07/2021 INITIAL  2 Patient will go up and down 12 steps without pain in order to progress towards return to work  Baseline: 06/07/2021 INITIAL  3 Patient will demonstrate a 78% ability on FOTO in order to demonstrate improved self perceived function    Baseline: 06/07/2021 INITIAL  PLAN: PT FREQUENCY: 2x/week   PT DURATION: 8 weeks   PLANNED INTERVENTIONS: Therapeutic exercises, Therapeutic activity, Neuro Muscular re-education, Balance training, Gait training, Patient/Family education, Joint mobilization, Aquatic Therapy, Dry Needling, Electrical stimulation, Cryotherapy, Moist heat, Taping, Ultrasound, and Manual therapy   PLAN FOR NEXT SESSION: continue to progress per protocol. Monitor motion. Patient  only requires 1x a week 2nd to reaching protocol limits early. Progress weight bearing as tolerated. Patient is one week post-op.        Dessie Coma PT DPT  04/21/2021, 3:55 PM

## 2021-04-21 ENCOUNTER — Encounter (HOSPITAL_BASED_OUTPATIENT_CLINIC_OR_DEPARTMENT_OTHER): Payer: Self-pay | Admitting: Physical Therapy

## 2021-04-24 ENCOUNTER — Encounter (HOSPITAL_BASED_OUTPATIENT_CLINIC_OR_DEPARTMENT_OTHER): Payer: Self-pay | Admitting: Physical Therapy

## 2021-04-24 ENCOUNTER — Other Ambulatory Visit: Payer: Self-pay

## 2021-04-24 ENCOUNTER — Ambulatory Visit (HOSPITAL_BASED_OUTPATIENT_CLINIC_OR_DEPARTMENT_OTHER): Payer: BC Managed Care – PPO | Admitting: Physical Therapy

## 2021-04-24 DIAGNOSIS — M25672 Stiffness of left ankle, not elsewhere classified: Secondary | ICD-10-CM

## 2021-04-24 DIAGNOSIS — M25572 Pain in left ankle and joints of left foot: Secondary | ICD-10-CM

## 2021-04-24 DIAGNOSIS — R2689 Other abnormalities of gait and mobility: Secondary | ICD-10-CM

## 2021-04-24 DIAGNOSIS — R600 Localized edema: Secondary | ICD-10-CM

## 2021-04-24 NOTE — Therapy (Signed)
OUTPATIENT PHYSICAL THERAPY TREATMENT NOTE   Patient Name: Roy Kennedy MRN: [id number] DOB:[birth year], 31 y.o., male Today's Date: 04/24/2021  PCP: Patient, No Pcp Per (Inactive) REFERRING PROVIDER: Huel Cote, MD   PT End of Session - 04/24/21 1536     Visit Number 5    Number of Visits 16    Date for PT Re-Evaluation 05/24/21    Authorization Type BCBS    PT Start Time 1515    PT Stop Time 1558    PT Time Calculation (min) 43 min    Activity Tolerance Patient tolerated treatment well    Behavior During Therapy WFL for tasks assessed/performed             Past Medical History:  Diagnosis Date   ADHD (attention deficit hyperactivity disorder)    during elementary years   GERD (gastroesophageal reflux disease)    Past Surgical History:  Procedure Laterality Date   ACHILLES TENDON SURGERY Right 03/20/2021   Procedure: RIGHT ACHILLES TENDON REPAIR;  Surgeon: Huel Cote, MD;  Location: [facility] OR;  Service: Orthopedics;  Laterality: Right;   OPEN REPAIR PERIARTICULAR FRACTURE / DISLOCATION ELBOW     Patient Active Problem List   Diagnosis Date Noted   Achilles tendon tear, right, initial encounter      PCP: Patient, No Pcp Per (Inactive)   REFERRING PROVIDER: Huel Cote, MD   REFERRING DIAG: M76.61 (ICD-10-CM) - Achilles tendinitis, right leg   THERAPY DIAG:  Right achilles tear    ONSET DATE: 03/20/2021   SUBJECTIVE:    SUBJECTIVE STATEMENT: The patient fell last night. He did not have his boot on. He felt like it pulled on the area.  He feels like the pain is about the same now. He is having pain in his lateral foot.   PERTINENT HISTORY: ADHD   PAIN:  1/6 Are you having pain? 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: [employer]    PLOF: Independent   PATIENT GOALS    To return to prior level of function      OBJECTIVE:    PATIENT SURVEYS:  FOTO 27% intake expected 78 in 17 visits      TODAY'S TREATMENT: 1/9   Supine Straight Leg Raises - 2 sets - 20 reps 1 lb  Sidelying Hip Abduction 3lbs 2 sets - 10 reps 1lb  Porne hip extension 2x20 1lb  SAQ 2lb 2x15  LAQ 2lb 2x15  BAPS board 20x in all directions  Gait: Patient has been using 1 crutch   [place]   1/6 Supine Straight Leg Raises - 2 sets - 10 reps Sidelying Hip Abduction 3lbs 3 sets - 10 reps Porne hip extension 2x10  SAQ 2lb 2x15  LAQ 2lb 2x15  BAPS board 20x in all directions  Gait: Reviewed use of 1 crutch in clinic; cuing for posture PROM into flexion but reached protocol limit quickly  Manual therapy to the calf     12/28  Ankle PROM to tolerance into IV and EV     Exercises Supine Straight Leg Raises - 2 sets -  10 reps Sidelying Hip Abduction 3lbs 3 sets - 10 reps Long Sitting Ankle Pumps - 10x Supine Ankle Inversion Eversion AROM - 20x Ankle Circles 20x BAPS board 20x in all directions   Gait training PWB with CAM Boot and crutches Stair management technique/safety/sequencing     PATIENT EDUCATION:  Education details: reviewed symptom management and things to look for.   Person educated: Patient Education method: Explanation, Demonstration, Tactile cues, and Verbal cues Education comprehension: verbalized understanding, returned demonstration, verbal cues required, tactile cues required, and needs further education     HOME EXERCISE PROGRAM: Access Code: [email or web address] URL: [email or web address] Date: 03/29/2021 Prepared by: Lorayne Bender   ASSESSMENT:   CLINICAL IMPRESSION: Despite fall, patient seemed to  tolerate treatment well. We did not advance anything, but we also kept the treatment the same. His pain did not go above the baseline 3/10. He was 4/10 last visit. He has full ROM at this time. Therapy focused on TFM to lateral tendons and his calf but did not over stretch the achilles. We will progress him to standing exercises as tolerated.    Objective impairments include Abnormal gait, decreased activity tolerance, decreased balance, decreased endurance, decreased mobility, difficulty walking, decreased ROM, decreased strength, impaired sensation, and pain. These impairments are limiting patient from driving, occupation, laundry, yard work, and shopping.No personal factors including     are also affecting patient's functional outcome. Patient will benefit from skilled PT to address above impairments and improve overall function.   REHAB POTENTIAL: Good   CLINICAL DECISION MAKING: Stable/uncomplicated   EVALUATION COMPLEXITY: Low     GOALS: Goals reviewed with patient? No   SHORT TERM GOALS:   STG Name Target Date Goal status  1 Patient will increase passive DF to 10 degrees per protocol  Baseline:  05/10/2021 1/9  Achieved PROM to 15 degrees   2 Patient will ambulate 200' without crutches in boot  Baseline:  05/10/2021 Still using 1 crutch 1/9  3 Patient will be independent with basic left lower extremity strengthening program  Baseline: 04/26/2021 Achieved performing at home   LONG TERM GOALS:    LTG Name Target Date Goal status  1 Patient will ambualte 3000' without boot and without increase pain in order to perform IADL's  Baseline: 06/07/2021 INITIAL  2 Patient will go up and down 12 steps without pain in order to progress towards return to work  Baseline: 06/07/2021 INITIAL  3 Patient will demonstrate a 78% ability on FOTO in order to demonstrate improved self perceived function    Baseline: 06/07/2021 INITIAL  PLAN: PT FREQUENCY: 2x/week   PT DURATION: 8 weeks   PLANNED  INTERVENTIONS: Therapeutic exercises, Therapeutic activity, Neuro Muscular re-education, Balance training, Gait training, Patient/Family education, Joint mobilization, Aquatic Therapy, Dry Needling, Electrical stimulation, Cryotherapy, Moist heat, Taping, Ultrasound, and Manual therapy   PLAN FOR NEXT SESSION: continue to progress per protocol. Monitor motion. Patient only requires 1x a week 2nd to reaching protocol limits early. Progress weight bearing as tolerated. Patient is 4 week post-op.        Dessie Coma PT DPT  04/24/2021, 4:07 PM

## 2021-05-04 ENCOUNTER — Other Ambulatory Visit: Payer: Self-pay

## 2021-05-04 ENCOUNTER — Encounter (HOSPITAL_BASED_OUTPATIENT_CLINIC_OR_DEPARTMENT_OTHER): Payer: Self-pay | Admitting: Physical Therapy

## 2021-05-04 ENCOUNTER — Ambulatory Visit (HOSPITAL_BASED_OUTPATIENT_CLINIC_OR_DEPARTMENT_OTHER): Payer: BC Managed Care – PPO | Admitting: Physical Therapy

## 2021-05-04 ENCOUNTER — Ambulatory Visit (INDEPENDENT_AMBULATORY_CARE_PROVIDER_SITE_OTHER): Payer: BC Managed Care – PPO | Admitting: Orthopaedic Surgery

## 2021-05-04 DIAGNOSIS — M25572 Pain in left ankle and joints of left foot: Secondary | ICD-10-CM

## 2021-05-04 DIAGNOSIS — R2689 Other abnormalities of gait and mobility: Secondary | ICD-10-CM

## 2021-05-04 DIAGNOSIS — M25672 Stiffness of left ankle, not elsewhere classified: Secondary | ICD-10-CM

## 2021-05-04 DIAGNOSIS — R600 Localized edema: Secondary | ICD-10-CM

## 2021-05-04 DIAGNOSIS — M7661 Achilles tendinitis, right leg: Secondary | ICD-10-CM

## 2021-05-04 MED ORDER — IBUPROFEN 800 MG PO TABS: 800.0000 mg | ORAL_TABLET | Freq: Three times a day (TID) | ORAL | 0 refills | Status: AC

## 2021-05-04 MED ORDER — MELATONIN 1 MG PO TABS: 1.0000 mg | ORAL_TABLET | Freq: Every day | ORAL | 0 refills | Status: AC

## 2021-05-04 NOTE — Addendum Note (Signed)
Addended by: Benancio Deeds on: 05/04/2021 03:09 PM   Modules accepted: Orders

## 2021-05-04 NOTE — Therapy (Signed)
OUTPATIENT PHYSICAL THERAPY TREATMENT NOTE   Patient Name: Roy Kennedy MRN: [id number] DOB:25-Sep-1990, 31 y.o., male Today's Date: 05/04/2021  PCP: Patient, No Pcp Per (Inactive) REFERRING PROVIDER: Vanetta Mulders, MD   PT End of Session - 05/04/21 1326     Visit Number 6    Number of Visits 16    Date for PT Re-Evaluation 05/24/21    Authorization Type BCBS    PT Start Time 1320    PT Stop Time 1400    PT Time Calculation (min) 40 min    Activity Tolerance Patient tolerated treatment well    Behavior During Therapy WFL for tasks assessed/performed             Past Medical History:  Diagnosis Date   ADHD (attention deficit hyperactivity disorder)    during elementary years   GERD (gastroesophageal reflux disease)    Past Surgical History:  Procedure Laterality Date   ACHILLES TENDON SURGERY Right 03/20/2021   Procedure: RIGHT ACHILLES TENDON REPAIR;  Surgeon: Vanetta Mulders, MD;  Location: [facility];  Service: Orthopedics;  Laterality: Right;   OPEN REPAIR PERIARTICULAR FRACTURE / DISLOCATION ELBOW     Patient Active Problem List   Diagnosis Date Noted   Achilles tendon tear, right, initial encounter      PCP: Patient, No Pcp Per (Inactive)   REFERRING PROVIDER: Vanetta Mulders, MD   REFERRING DIAG: M76.61 (ICD-10-CM) - Achilles tendinitis, right leg   THERAPY DIAG:  Right achilles tear    ONSET DATE: 03/20/2021   SUBJECTIVE:    SUBJECTIVE STATEMENT: The patient reports the pain is better then when he fell. He continues to use crutches because he is afraid to fall again. His pain today is still in his lateral ankle. He has been working on his exercises.  PERTINENT HISTORY: ADHD   PAIN:  1/6 Are you having pain? 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: [employer]    PLOF: Independent   PATIENT GOALS    To return to prior level of function      OBJECTIVE:    PATIENT SURVEYS:  FOTO 27% intake expected 78 in 17 visits      TODAY'S TREATMENT: 1/19 Supine Straight Leg Raises - 1 sets - 30 reps 2 lb  Sidelying Hip Abduction 3lbs 1 sets - 30 reps 2lb  Prone hip extension 2x20 2lb   LAQ 2lb 2x15 2 lbs  BAPS board 20x in all directions  Seated heel raise x20   TFM to peroneal tendons   1/9   Supine Straight Leg Raises - 2 sets - 20 reps 1 lb  Sidelying Hip Abduction 3lbs 2 sets - 10 reps 1lb  Porne hip extension 2x20 1lb  SAQ 2lb 2x15  LAQ 2lb 2x15  BAPS board 20x in all directions Ankle Pumps 2x20  Eversion/inversion 2x20  Gait: Patient has been using 1 crutch   Thompson   1/6 Supine Straight Leg Raises - 2 sets - 10 reps Sidelying Hip Abduction 3lbs 3 sets - 10 reps Porne hip extension 2x10  SAQ 2lb 2x15  LAQ 2lb 2x15  BAPS board 20x in all directions  Gait: Reviewed use of 1 crutch in clinic; cuing for posture PROM into flexion but reached protocol limit quickly  Manual therapy to the calf     12/28  Ankle PROM to tolerance into IV and EV     Exercises Supine Straight Leg Raises - 2 sets - 10 reps Sidelying Hip Abduction 3lbs 3 sets - 10 reps Long Sitting Ankle Pumps - 10x Supine Ankle Inversion Eversion AROM - 20x Ankle Circles 20x BAPS board 20x in all directions   Gait training PWB with CAM Boot and crutches Stair management technique/safety/sequencing     PATIENT EDUCATION:  Education details: reviewed symptom management and things to look for.   Person educated: Patient Education method: Explanation, Demonstration, Tactile cues, and Verbal cues Education comprehension: verbalized  understanding, returned demonstration, verbal cues required, tactile cues required, and needs further education     HOME EXERCISE PROGRAM: Access Code: [email or web address] URL: [email or web address] Date: 03/29/2021 Prepared by: Carolyne Littles   ASSESSMENT:   CLINICAL IMPRESSION: Patient continues to have pain in the lateral portion of his ankle. He is demonstrating improved control with his ankle with exercises. He has full ROM. He continues to use the crutches outside of the house. He is using it less at home. Overall he is progressing well. He was advised to start weaning completely off the crutch, but he is hesitant because of the fall. We will continue to progress him per protocol.   Objective impairments include Abnormal gait, decreased activity tolerance, decreased balance, decreased endurance, decreased mobility, difficulty walking, decreased ROM, decreased strength, impaired sensation, and pain. These impairments are limiting patient from driving, occupation, laundry, yard work, and shopping.No personal factors including     are also affecting patient's functional outcome. Patient will benefit from skilled PT to address above impairments and improve overall function.   REHAB POTENTIAL: Good   CLINICAL DECISION MAKING: Stable/uncomplicated   EVALUATION COMPLEXITY: Low     GOALS: Goals reviewed with patient? No   SHORT TERM GOALS:   STG Name Target Date Goal status  1 Patient will increase passive DF to 10 degrees per protocol  Baseline:  05/10/2021 1/9  Achieved PROM to 15 degrees   2 Patient will ambulate 200' without crutches in boot  Baseline:  05/10/2021 Still using 1 crutch 1/9  3 Patient will be independent with basic left lower extremity strengthening program  Baseline: 04/26/2021 Achieved performing at home   LONG TERM GOALS:    LTG Name Target Date Goal status  1 Patient will ambualte 3000' without boot and without increase pain in order to perform IADL's   Baseline: 06/07/2021 INITIAL  2 Patient will go up and down 12 steps without pain in order to progress towards return to work  Baseline: 06/07/2021 INITIAL  3 Patient will demonstrate a 78% ability on FOTO in order to demonstrate improved self perceived function    Baseline: 06/07/2021 INITIAL  PLAN: PT FREQUENCY: 2x/week   PT DURATION: 8 weeks   PLANNED INTERVENTIONS: Therapeutic exercises, Therapeutic activity, Neuro Muscular re-education, Balance training, Gait training, Patient/Family education, Joint mobilization, Aquatic Therapy, Dry Needling, Electrical stimulation, Cryotherapy, Moist heat, Taping, Ultrasound, and Manual therapy   PLAN FOR NEXT SESSION: continue to progress per protocol. Monitor motion. Patient only requires 1x a week 2nd to reaching protocol limits early. Progress weight bearing as tolerated. Patient is 4 week post-op.        Carney Living PT DPT  05/04/2021, 2:09 PM

## 2021-05-04 NOTE — Progress Notes (Signed)
Post Operative Evaluation    Procedure/Date of Surgery: Right Achilles tendon repair March 20, 2021  Interval History:    Presents today 6 weeks status post the above procedure.  He is overall doing very well.  He is amenable.  He is working with physical therapy.  He walks without any type of gait assistive devices.  He does endorse some numbness on the plantar aspect of the foot.  He is remained out of work   PMH/PSH/Family History/Social History/Meds/Allergies:    Past Medical History:  Diagnosis Date   ADHD (attention deficit hyperactivity disorder)    during elementary years   GERD (gastroesophageal reflux disease)    Past Surgical History:  Procedure Laterality Date   ACHILLES TENDON SURGERY Right 03/20/2021   Procedure: RIGHT ACHILLES TENDON REPAIR;  Surgeon: Huel Cote, MD;  Location: [facility];  Service: Orthopedics;  Laterality: Right;   OPEN REPAIR PERIARTICULAR FRACTURE / DISLOCATION ELBOW     Social History   Socioeconomic History   Marital status: Single    Spouse name: Not on file   Number of children: Not on file   Years of education: Not on file   Highest education level: Not on file  Occupational History   Not on file  Tobacco Use   Smoking status: Some Days    Types: Cigarettes, Cigars   Smokeless tobacco: Never  Vaping Use   Vaping Use: Never used  Substance and Sexual Activity   Alcohol use: Yes    Comment: rarely   Drug use: Not Currently   Sexual activity: Not on file  Other Topics Concern   Not on file  Social History Narrative   Not on file   Social Determinants of Health   Financial Resource Strain: Not on file  Food Insecurity: Not on file  Transportation Needs: Not on file  Physical Activity: Not on file  Stress: Not on file  Social Connections: Not on file   Family History  Problem Relation Age of Onset   Hypertension Mother    No Known Allergies Current Outpatient Medications   Medication Sig Dispense Refill   meloxicam (MOBIC) 15 MG tablet Take 1 tablet (15 mg total) by mouth daily. 30 tablet 0   aspirin EC 325 MG tablet Take 1 tablet (325 mg total) by mouth daily. 14 tablet 0   cyclobenzaprine (FLEXERIL) 10 MG tablet Take 1 tablet (10 mg total) by mouth 2 (two) times daily as needed for muscle spasms. 20 tablet 0   ibuprofen (ADVIL) 600 MG tablet Take 1 tablet (600 mg total) by mouth every 6 (six) hours as needed for moderate pain. 30 tablet 0   methocarbamol (ROBAXIN) 500 MG tablet Take 1 tablet (500 mg total) by mouth 4 (four) times daily. 30 tablet 0   oxyCODONE (OXY IR/ROXICODONE) 5 MG immediate release tablet Take 1 tablet (5 mg total) by mouth every 4 (four) hours as needed (severe pain). 20 tablet 0   No current facility-administered medications for this visit.   No results found.  Review of Systems:   A ROS was performed including pertinent positives and negatives as documented in the HPI.   Musculoskeletal Exam:    There were no vitals taken for this visit.  Incision is well-healed.  No palpable gap about the Achilles.  He has active  dorsiflexion to 20 degrees with minimal swelling.  Plantar flexion to 35 degrees with good strength  Imaging:    None  I personally reviewed and interpreted the radiographs.   Assessment:   31 year old male 6 weeks status post left Achilles tendon repair.  Overall he is doing very well.  At this time I would like him to wean out of his boot into normal shoe.  I would also like him to wean off his crutches.  We discussed that at the 8-week mark he may begin some gentle strengthening as well.  I will see him back in 6 weeks  Plan :    -He will see me back in 6 weeks      I personally saw and evaluated the patient, and participated in the management and treatment plan.  Huel Cote, MD Attending Physician, Orthopedic Surgery  This document was dictated using Dragon voice recognition software. A  reasonable attempt at proof reading has been made to minimize errors.

## 2021-05-05 ENCOUNTER — Other Ambulatory Visit (HOSPITAL_BASED_OUTPATIENT_CLINIC_OR_DEPARTMENT_OTHER): Payer: Self-pay | Admitting: Orthopaedic Surgery

## 2021-05-05 ENCOUNTER — Telehealth (HOSPITAL_BASED_OUTPATIENT_CLINIC_OR_DEPARTMENT_OTHER): Payer: Self-pay | Admitting: Orthopaedic Surgery

## 2021-05-05 DIAGNOSIS — M1A0710 Idiopathic chronic gout, right ankle and foot, without tophus (tophi): Secondary | ICD-10-CM

## 2021-05-05 NOTE — Telephone Encounter (Signed)
Possible gout in first toe

## 2021-05-15 ENCOUNTER — Other Ambulatory Visit (HOSPITAL_BASED_OUTPATIENT_CLINIC_OR_DEPARTMENT_OTHER): Payer: Self-pay | Admitting: Orthopaedic Surgery

## 2021-05-25 ENCOUNTER — Ambulatory Visit (HOSPITAL_BASED_OUTPATIENT_CLINIC_OR_DEPARTMENT_OTHER): Payer: Self-pay | Attending: Orthopaedic Surgery | Admitting: Physical Therapy

## 2021-05-25 ENCOUNTER — Other Ambulatory Visit: Payer: Self-pay

## 2021-05-25 ENCOUNTER — Encounter (HOSPITAL_BASED_OUTPATIENT_CLINIC_OR_DEPARTMENT_OTHER): Payer: Self-pay | Admitting: Physical Therapy

## 2021-05-25 DIAGNOSIS — R2689 Other abnormalities of gait and mobility: Secondary | ICD-10-CM | POA: Insufficient documentation

## 2021-05-25 DIAGNOSIS — R600 Localized edema: Secondary | ICD-10-CM | POA: Insufficient documentation

## 2021-05-25 DIAGNOSIS — M25672 Stiffness of left ankle, not elsewhere classified: Secondary | ICD-10-CM | POA: Insufficient documentation

## 2021-05-25 DIAGNOSIS — M25572 Pain in left ankle and joints of left foot: Secondary | ICD-10-CM | POA: Insufficient documentation

## 2021-05-25 NOTE — Therapy (Addendum)
OUTPATIENT PHYSICAL THERAPY RE-CERTIFICATION NOTE   Patient Name: Roy Kennedy MRN: [id number] DOB:January 09, 1991, 31 y.o., male Today's Date: 05/25/2021  PCP: Patient, No Pcp Per (Inactive) REFERRING PROVIDER: No ref. provider found   PT End of Session - 05/25/21 1523     Visit Number 7    Number of Visits 16    Date for PT Re-Evaluation 08/30/2021    Authorization Type BCBS    PT Start Time 1515    PT Stop Time 1600    PT Time Calculation (min) 45 min    Activity Tolerance Patient tolerated treatment well    Behavior During Therapy WFL for tasks assessed/performed              Past Medical History:  Diagnosis Date   ADHD (attention deficit hyperactivity disorder)    during elementary years   GERD (gastroesophageal reflux disease)    Past Surgical History:  Procedure Laterality Date   ACHILLES TENDON SURGERY Right 03/20/2021   Procedure: RIGHT ACHILLES TENDON REPAIR;  Surgeon: Huel Cote, MD;  Location: [facility];  Service: Orthopedics;  Laterality: Right;   OPEN REPAIR PERIARTICULAR FRACTURE / DISLOCATION ELBOW     Patient Active Problem List   Diagnosis Date Noted   Achilles tendon tear, right, initial encounter      PCP: Patient, No Pcp Per (Inactive)   REFERRING PROVIDER: Huel Cote, MD   REFERRING DIAG: M76.61 (ICD-10-CM) - Achilles tendinitis, right leg   THERAPY DIAG:  Right achilles tear    ONSET DATE: 03/20/2021   SUBJECTIVE:    SUBJECTIVE STATEMENT: Pt states he is doing well. He is ready to start strengthening.   PERTINENT HISTORY: ADHD   PAIN:  1/6 Are you having pain? 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: [employer]    PLOF: Independent   PATIENT GOALS    To return to prior level of function      OBJECTIVE:    PATIENT SURVEYS:  FOTO 27% intake expected 78 in 17 visits   56 at 7th visit 2/6  LE AROM   PROM Right 03/28/2021 Left 03/28/2021 R  Hip flexion       Hip extension       Hip abduction       Hip adduction       Hip internal rotation       Hip external rotation       Knee flexion       Knee extension       Ankle dorsiflexion   3 20 deg  Ankle plantarflexion   15 not pushed to end range    50 deg  Ankle inversion   No pain limited movement  20  Ankle eversion   Pain in inferior lateralmalleolus  30   (Blank rows = not tested)    LE MMT:       Untested at this time due to surgical precautions       GAIT: Decreased toe off with R ankle, decreased stance time on R, L weight shift with CKC movements   TODAY'S  TREATMENT:  2/9  Exercises Squat - 1 x daily - 7 x weekly - 3 sets - 10 reps Standing Heel Raise with Chair Support - 1 x daily - 7 x weekly - 3 sets - 10 reps Single Leg Stance - 1 x daily - 7 x weekly - 1 sets - 4 reps - 30 hold Seated Heel Raise - 1 x daily - 7 x weekly - 3 sets - 10 reps Side Stepping with Resistance at Ankles - 1 x daily - 7 x weekly - 1 sets - 2 reps - 1ft hold   1/19 Supine Straight Leg Raises - 1 sets - 30 reps 2 lb  Sidelying Hip Abduction 3lbs 1 sets - 30 reps 2lb  Prone hip extension 2x20 2lb   LAQ 2lb 2x15 2 lbs  BAPS board 20x in all directions  Seated heel raise x20   TFM to peroneal tendons   1/9   Supine Straight Leg Raises - 2 sets - 20 reps 1 lb  Sidelying Hip Abduction 3lbs 2 sets - 10 reps 1lb  Porne hip extension 2x20 1lb  SAQ 2lb 2x15  LAQ 2lb 2x15  BAPS board 20x in all directions Ankle Pumps 2x20  Eversion/inversion 2x20  Gait: Patient has been using 1 crutch   Thompson   1/6 Supine Straight Leg Raises - 2 sets -  10 reps Sidelying Hip Abduction 3lbs 3 sets - 10 reps Porne hip extension 2x10  SAQ 2lb 2x15  LAQ 2lb 2x15  BAPS board 20x in all directions  Gait: Reviewed use of 1 crutch in clinic; cuing for posture PROM into flexion but reached protocol limit quickly  Manual therapy to the calf     12/28  Ankle PROM to tolerance into IV and EV     Exercises Supine Straight Leg Raises - 2 sets - 10 reps Sidelying Hip Abduction 3lbs 3 sets - 10 reps Long Sitting Ankle Pumps - 10x Supine Ankle Inversion Eversion AROM - 20x Ankle Circles 20x BAPS board 20x in all directions   Gait training PWB with CAM Boot and crutches Stair management technique/safety/sequencing     PATIENT EDUCATION:  Education details: anatomy, exercise progression, DOMS expectations, muscle firing,  envelope of function, HEP, POC Person educated: Patient Education method: Explanation, Demonstration, Tactile cues, and Verbal cues Education comprehension: verbalized understanding, returned demonstration, verbal cues required, tactile cues required, and needs further education     HOME EXERCISE PROGRAM: Access Code: [email or web address] URL: [email or web address] Date: 03/29/2021 Prepared by: Lorayne Bender   ASSESSMENT:   CLINICAL IMPRESSION: Pt has reach 8 week mark at this point. Pt able to continue as expected per protocol and able to begin light strength and stability. Pt has expected levels of PF weakness in [place] and CKC. Pt with normal ROM at this point in time. Plan to review strength at next session and progress from 2--->1 as able. Consider use of shuttle leg press to train up eccentric calf strength. HEP updated and fully reviewed. Plan to progress per Achilles tendon repair protocol. Pt has well managed pain at this time.   Objective impairments include Abnormal gait, decreased activity tolerance, decreased balance, decreased endurance, decreased mobility, difficulty walking, decreased ROM, decreased  strength, impaired sensation, and pain. These impairments are limiting patient from driving, occupation, laundry, yard work, and shopping.No personal factors including     are also affecting patient's functional outcome. Patient will benefit from skilled PT to address above impairments and improve overall function.   REHAB  POTENTIAL: Good   CLINICAL DECISION MAKING: Stable/uncomplicated   EVALUATION COMPLEXITY: Low     GOALS: Goals reviewed with patient? No   SHORT TERM GOALS:   STG Name Target Date Goal status  1 Patient will increase passive DF to 10 degrees per protocol  Baseline:  05/10/2021 1/9  Achieved PROM to 15 degrees   2 Patient will ambulate 200' without crutches in boot  Baseline:  05/10/2021 Still using 1 crutch 1/9  3 Patient will be independent with basic left lower extremity strengthening program  Baseline: 04/26/2021 Achieved performing at home   LONG TERM GOALS:    LTG Name Target Date Goal status  1 Patient will ambualte 3000' without boot and without increase pain in order to perform IADL's  Baseline: 06/07/2021 ongoing  2 Patient will go up and down 12 steps without pain in order to progress towards return to work  Baseline: 06/07/2021 ongoing  3 Patient will demonstrate a 78% ability on FOTO in order to demonstrate improved self perceived function    Baseline: 06/07/2021 ongoing  PLAN: PT FREQUENCY: 2x/week   PT DURATION: 8 weeks   PLANNED INTERVENTIONS: Therapeutic exercises, Therapeutic activity, Neuro Muscular re-education, Balance training, Gait training, Patient/Family education, Joint mobilization, Aquatic Therapy, Dry Needling, Electrical stimulation, Cryotherapy, Moist heat, Taping, Ultrasound, and Manual therapy   PLAN FOR NEXT SESSION: continue to progress per protocol, 8 weeks, progress strength per MD note     Zebedee Iba PT DPT  05/25/2021, 4:01 PM

## 2021-06-01 ENCOUNTER — Ambulatory Visit (HOSPITAL_BASED_OUTPATIENT_CLINIC_OR_DEPARTMENT_OTHER): Payer: Self-pay | Admitting: Physical Therapy

## 2021-06-01 NOTE — Addendum Note (Signed)
Addended by: Zebedee Iba on: 06/01/2021 01:10 PM   Modules accepted: Orders

## 2021-06-01 NOTE — Therapy (Incomplete)
[facility]  OUTPATIENT PHYSICAL THERAPY TREATMENT NOTE   Patient Name: Roy Kennedy MRN: [id number] DOB:[birth year], 31 y.o., male Today's Date: 06/01/2021  PCP: Patient, No Pcp Per (Inactive) REFERRING PROVIDER: Vanetta Mulders, MD    Past Medical History:  Diagnosis Date   ADHD (attention deficit hyperactivity disorder)    during elementary years   GERD (gastroesophageal reflux disease)    Past Surgical History:  Procedure Laterality Date   ACHILLES TENDON SURGERY Right 03/20/2021   Procedure: RIGHT ACHILLES TENDON REPAIR;  Surgeon: Vanetta Mulders, MD;  Location: [facility];  Service: Orthopedics;  Laterality: Right;   OPEN REPAIR PERIARTICULAR FRACTURE / DISLOCATION ELBOW     Patient Active Problem List   Diagnosis Date Noted   Achilles tendon tear, right, initial encounter       PCP: Patient, No Pcp Per (Inactive)   REFERRING PROVIDER: Vanetta Mulders, MD   REFERRING DIAG: M76.61 (ICD-10-CM) - Achilles tendinitis, right leg   THERAPY DIAG:  Right achilles tear    ONSET DATE: 03/20/2021   SUBJECTIVE:    SUBJECTIVE STATEMENT: Pt states he is doing well. He is ready to start strengthening.   PERTINENT HISTORY: ADHD   PAIN:  1/6 Are you having pain? 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: [employer]    PLOF: Independent   PATIENT GOALS    To return to prior level of function      OBJECTIVE:    PATIENT SURVEYS:  FOTO 27% intake expected 78 in 17 visits   56 at 7th visit 2/6    TODAY'S TREATMENT:  2/16  Upright bike 8 min warm up Shuttle heel raise DL and  up2down1 Fwd and lat marching 6x cones 4x laps Squat - 1 x daily - 7 x weekly - 3 sets - 10 reps Standing Heel Raise with Chair Support - 1 x daily - 7 x weekly - 3 sets - 10 reps Single Leg Stance - 1 x daily - 7 x weekly - 1 sets - 4 reps - 30 hold Seated Heel Raise - 1 x daily - 7 x weekly - 3 sets - 10 reps Side Stepping with Resistance at Ankles - 1 x daily - 7 x weekly - 1 sets - 2 reps - 38ft hold  2/9  Exercises Squat - 1 x daily - 7 x weekly - 3 sets - 10 reps Standing Heel Raise with Chair Support - 1 x daily - 7 x weekly - 3 sets - 10 reps Single Leg Stance - 1 x daily - 7 x weekly - 1 sets - 4 reps - 30 hold Seated Heel Raise - 1 x daily - 7 x weekly - 3 sets - 10 reps Side Stepping with Resistance at Ankles - 1 x daily - 7 x weekly - 1 sets - 2 reps - 72ft hold   1/19 Supine Straight Leg Raises - 1 sets - 30 reps 2 lb  Sidelying Hip Abduction 3lbs 1 sets - 30 reps 2lb  Prone hip extension 2x20 2lb   LAQ 2lb 2x15 2 lbs  BAPS  board 20x in all directions  Seated heel raise x20   TFM to peroneal tendons   1/9   Supine Straight Leg Raises - 2 sets - 20 reps 1 lb  Sidelying Hip Abduction 3lbs 2 sets - 10 reps 1lb  Porne hip extension 2x20 1lb  SAQ 2lb 2x15  LAQ 2lb 2x15  BAPS board 20x in all directions Ankle Pumps 2x20  Eversion/inversion 2x20  Gait: Patient has been using 1 crutch   Thompson   1/6 Supine Straight Leg Raises - 2 sets - 10 reps Sidelying Hip Abduction 3lbs 3 sets - 10 reps Porne hip extension 2x10  SAQ 2lb 2x15  LAQ 2lb 2x15  BAPS board 20x in all directions  Gait: Reviewed use of 1 crutch in clinic; cuing for posture PROM into flexion but reached protocol limit quickly  Manual therapy to the calf     12/28  Ankle PROM to tolerance into IV and EV     Exercises Supine Straight Leg Raises - 2 sets - 10 reps Sidelying Hip Abduction 3lbs 3 sets - 10 reps Long Sitting Ankle Pumps - 10x Supine Ankle Inversion Eversion AROM -  20x Ankle Circles 20x BAPS board 20x in all directions   Gait training PWB with CAM Boot and crutches Stair management technique/safety/sequencing     PATIENT EDUCATION:  Education details: anatomy, exercise progression, DOMS expectations, muscle firing,  envelope of function, HEP, POC Person educated: Patient Education method: Explanation, Demonstration, Tactile cues, and Verbal cues Education comprehension: verbalized understanding, returned demonstration, verbal cues required, tactile cues required, and needs further education     HOME EXERCISE PROGRAM: Access Code: [email or web address] URL: [email or web address] Date: 03/29/2021 Prepared by: Carolyne Littles   ASSESSMENT:   CLINICAL IMPRESSION: Pt session limited by late arrival. Pt was able to continue with PF strength of the R ankle today and focus on improving eccentric motor control in the R ankle. Pt with difficulty especially in CKC but was able to maintain full PF hold in gravity reduced environment. No pain during session. Plan to continue strength per protocol and progress 2-->1 strength.   Objective impairments include Abnormal gait, decreased activity tolerance, decreased balance, decreased endurance, decreased mobility, difficulty walking, decreased ROM, decreased strength, impaired sensation, and pain. These impairments are limiting patient from driving, occupation, laundry, yard work, and shopping.No personal factors including     are also affecting patient's functional outcome. Patient will benefit from skilled PT to address above impairments and improve overall function.   REHAB POTENTIAL: Good   CLINICAL DECISION MAKING: Stable/uncomplicated   EVALUATION COMPLEXITY: Low     GOALS: Goals reviewed with patient? No   SHORT TERM GOALS:   STG Name Target Date Goal status  1 Patient will increase passive DF to 10 degrees per protocol  Baseline:  05/10/2021 1/9  Achieved PROM to 15 degrees   2 Patient will  ambulate 200' without crutches in boot  Baseline:  05/10/2021 Still using 1 crutch 1/9  3 Patient will be independent with basic left lower extremity strengthening program  Baseline: 04/26/2021 Achieved performing at home   LONG TERM GOALS:    LTG Name Target Date Goal status  1 Patient will ambualte 3000' without boot and without increase pain in order to perform IADL's  Baseline: 06/07/2021 ongoing  2 Patient will go up and down 12 steps without pain in order to progress towards return to work  Baseline: 06/07/2021 ongoing  3 Patient will demonstrate a 78% ability on FOTO in order  to demonstrate improved self perceived function    Baseline: 06/07/2021 ongoing  PLAN: PT FREQUENCY: 2x/week   PT DURATION: 8 weeks   PLANNED INTERVENTIONS: Therapeutic exercises, Therapeutic activity, Neuro Muscular re-education, Balance training, Gait training, Patient/Family education, Joint mobilization, Aquatic Therapy, Dry Needling, Electrical stimulation, Cryotherapy, Moist heat, Taping, Ultrasound, and Manual therapy   PLAN FOR NEXT SESSION: continue to progress per protocol, 8 weeks, progress strength per MD note     Daleen Bo PT DPT  06/01/2021, 3:14 PM

## 2021-06-08 ENCOUNTER — Ambulatory Visit (HOSPITAL_BASED_OUTPATIENT_CLINIC_OR_DEPARTMENT_OTHER): Payer: Self-pay | Admitting: Physical Therapy

## 2021-06-08 ENCOUNTER — Encounter (HOSPITAL_BASED_OUTPATIENT_CLINIC_OR_DEPARTMENT_OTHER): Payer: Self-pay | Admitting: Physical Therapy

## 2021-06-08 ENCOUNTER — Other Ambulatory Visit: Payer: Self-pay

## 2021-06-08 DIAGNOSIS — M25572 Pain in left ankle and joints of left foot: Secondary | ICD-10-CM

## 2021-06-08 DIAGNOSIS — M25672 Stiffness of left ankle, not elsewhere classified: Secondary | ICD-10-CM

## 2021-06-08 DIAGNOSIS — R600 Localized edema: Secondary | ICD-10-CM

## 2021-06-08 DIAGNOSIS — R2689 Other abnormalities of gait and mobility: Secondary | ICD-10-CM

## 2021-06-08 NOTE — Therapy (Signed)
[facility]  OUTPATIENT PHYSICAL THERAPY TREATMENT NOTE   Patient Name: Roy Kennedy MRN: [id number] DOB:07-Aug-1990, 31 y.o., male Today's Date: 06/08/2021  PCP: Patient, No Pcp Per (Inactive) REFERRING PROVIDER: Huel Cote, MD   PT End of Session - 06/08/21 1522     Visit Number 8    Number of Visits 16    Date for PT Re-Evaluation 08/30/21    Authorization Type BCBS    PT Start Time 1515    PT Stop Time 1555    PT Time Calculation (min) 40 min    Activity Tolerance Patient tolerated treatment well    Behavior During Therapy WFL for tasks assessed/performed             Past Medical History:  Diagnosis Date   ADHD (attention deficit hyperactivity disorder)    during elementary years   GERD (gastroesophageal reflux disease)    Past Surgical History:  Procedure Laterality Date   ACHILLES TENDON SURGERY Right 03/20/2021   Procedure: RIGHT ACHILLES TENDON REPAIR;  Surgeon: Huel Cote, MD;  Location: [facility];  Service: Orthopedics;  Laterality: Right;   OPEN REPAIR PERIARTICULAR FRACTURE / DISLOCATION ELBOW     Patient Active Problem List   Diagnosis Date Noted   Achilles tendon tear, right, initial encounter       PCP: Patient, No Pcp Per (Inactive)   REFERRING PROVIDER: Huel Cote, MD   REFERRING DIAG: M76.61 (ICD-10-CM) - Achilles tendinitis, right leg   THERAPY DIAG:  Right achilles tear    ONSET DATE: 03/20/2021   SUBJECTIVE:    SUBJECTIVE STATEMENT: Pt states the ankle is swollen today. He hiked about 3 miles on a paved trail and has more swelling a day after.  PERTINENT HISTORY: ADHD   PAIN:  1/6 Are you having pain? 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: [employer]    PLOF: Independent   PATIENT GOALS    To return to prior level of function      OBJECTIVE:    PATIENT SURVEYS:  FOTO 27% intake expected 78 in 17 visits   56 at 7th visit 2/6    TODAY'S TREATMENT:  2/16  Nu-step bike 6 min warm up Shuttle SL leg press 3x15 75lbs of bands Shuttle heel raise DL and up2 down 2 then up2 down 1 25 lbs  and then 12lbs 2x10 each Fwd and lat marching 6x cones 4x laps Standing gastroc stretch 30s 3x Squat 3 sets - 10 reps   2/9  Exercises Squat - 1 x daily - 7 x weekly - 3 sets - 10 reps Standing Heel Raise with Chair Support - 1 x daily - 7 x weekly - 3 sets - 10 reps Single Leg Stance - 1 x daily - 7 x weekly - 1 sets - 4 reps - 30 hold Seated Heel Raise - 1 x daily - 7 x weekly - 3 sets - 10 reps Side Stepping with Resistance at Ankles - 1 x daily - 7 x weekly - 1 sets - 2 reps - 64ft hold   1/19 Supine  Straight Leg Raises - 1 sets - 30 reps 2 lb  Sidelying Hip Abduction 3lbs 1 sets - 30 reps 2lb  Prone hip extension 2x20 2lb   LAQ 2lb 2x15 2 lbs  BAPS board 20x in all directions  Seated heel raise x20   TFM to peroneal tendons   1/9   Supine Straight Leg Raises - 2 sets - 20 reps 1 lb  Sidelying Hip Abduction 3lbs 2 sets - 10 reps 1lb  Porne hip extension 2x20 1lb  SAQ 2lb 2x15  LAQ 2lb 2x15  BAPS board 20x in all directions Ankle Pumps 2x20  Eversion/inversion 2x20  Gait: Patient has been using 1 crutch   Thompson   1/6 Supine Straight Leg Raises - 2 sets - 10 reps Sidelying Hip Abduction 3lbs 3 sets - 10 reps Porne hip extension 2x10  SAQ 2lb 2x15  LAQ 2lb 2x15  BAPS board 20x in all directions  Gait: Reviewed use of 1 crutch in clinic; cuing for posture PROM into flexion but reached protocol limit quickly  Manual therapy to the calf     12/28  Ankle PROM to tolerance into IV  and EV     Exercises Supine Straight Leg Raises - 2 sets - 10 reps Sidelying Hip Abduction 3lbs 3 sets - 10 reps Long Sitting Ankle Pumps - 10x Supine Ankle Inversion Eversion AROM - 20x Ankle Circles 20x BAPS board 20x in all directions   Gait training PWB with CAM Boot and crutches Stair management technique/safety/sequencing     PATIENT EDUCATION:  Education details: anatomy, exercise progression, DOMS expectations, muscle firing,  envelope of function, HEP, POC Person educated: Patient Education method: Explanation, Demonstration, Tactile cues, and Verbal cues Education comprehension: verbalized understanding, returned demonstration, verbal cues required, tactile cues required, and needs further education     HOME EXERCISE PROGRAM: Access Code: [email or web address] URL: [email or web address] Date: 03/29/2021 Prepared by: Lorayne Bender   ASSESSMENT:   CLINICAL IMPRESSION: Pt with good tolerance to introduction to light resisted PF training. Pt demonstrates significant eccentric strength deficits on R ankle and requires gravity reduced loading. Pt does have good SL stability with dynamic marching activity but does quickly fatigue with sustained repetition. Plan to continue with eccentric strength and progress to as tolerated. No plyometric loading.  Objective impairments include Abnormal gait, decreased activity tolerance, decreased balance, decreased endurance, decreased mobility, difficulty walking, decreased ROM, decreased strength, impaired sensation, and pain. These impairments are limiting patient from driving, occupation, laundry, yard work, and shopping.No personal factors including     are also affecting patient's functional outcome. Patient will benefit from skilled PT to address above impairments and improve overall function.   REHAB POTENTIAL: Good   CLINICAL DECISION MAKING: Stable/uncomplicated   EVALUATION COMPLEXITY: Low     GOALS: Goals reviewed with  patient? No   SHORT TERM GOALS:   STG Name Target Date Goal status  1 Patient will increase passive DF to 10 degrees per protocol  Baseline:  05/10/2021 1/9  Achieved PROM to 15 degrees   2 Patient will ambulate 200' without crutches in boot  Baseline:  05/10/2021 Still using 1 crutch 1/9  3 Patient will be independent with basic left lower extremity strengthening program  Baseline: 04/26/2021 Achieved performing at home   LONG TERM GOALS:    LTG Name Target Date Goal status  1 Patient will ambualte 3000' without boot and without increase pain in order to perform IADL's  Baseline: 06/07/2021 ongoing  2 Patient will go up  and down 12 steps without pain in order to progress towards return to work  Baseline: 06/07/2021 ongoing  3 Patient will demonstrate a 78% ability on FOTO in order to demonstrate improved self perceived function    Baseline: 06/07/2021 ongoing  PLAN: PT FREQUENCY: 2x/week   PT DURATION: 8 weeks   PLANNED INTERVENTIONS: Therapeutic exercises, Therapeutic activity, Neuro Muscular re-education, Balance training, Gait training, Patient/Family education, Joint mobilization, Aquatic Therapy, Dry Needling, Electrical stimulation, Cryotherapy, Moist heat, Taping, Ultrasound, and Manual therapy   PLAN FOR NEXT SESSION: continue to progress per protocol, 8 weeks, progress strength per MD note; leg pressing, tandem stance UE exercises     Zebedee Iba PT DPT  06/08/2021, 3:58 PM

## 2021-06-15 ENCOUNTER — Encounter (HOSPITAL_BASED_OUTPATIENT_CLINIC_OR_DEPARTMENT_OTHER): Payer: BC Managed Care – PPO | Admitting: Orthopaedic Surgery

## 2021-06-15 ENCOUNTER — Encounter (HOSPITAL_BASED_OUTPATIENT_CLINIC_OR_DEPARTMENT_OTHER): Payer: Self-pay | Admitting: Physical Therapy

## 2021-06-18 NOTE — Progress Notes (Deleted)
? ?  Office Visit Note ? ?Patient: Roy Kennedy             ?Date of Birth: 03-15-1991           ?MRN: [id number]             ?PCP: Patient, No Pcp Per (Inactive) ?Referring: Huel Cote, MD ?Visit Date: 06/19/2021 ?Occupation: @[employer] @ ? ?Subjective:  ?No chief complaint on file. ? ? ?History of Present Illness: Roy Kennedy is a 31 y.o. 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 ?03/13/21 Xray right foot ?IMPRESSION: ?Right first MTP joint osteoarthritis, compatible with hallux rigidus. ?No acute osseous finding ?Nonspecific slight soft tissue thickening of the Achilles region posteriorly. Correlate with exam. ? ?Activities of Daily Living:  ?Patient reports morning stiffness for *** {minute/hour:19697}.   ?Patient {ACTIONS;DENIES/REPORTS:21021675::"Denies"} nocturnal pain.  ?Difficulty dressing/grooming: {ACTIONS;DENIES/REPORTS:21021675::"Denies"} ?Difficulty climbing stairs: {ACTIONS;DENIES/REPORTS:21021675::"Denies"} ?Difficulty getting out of chair: {ACTIONS;DENIES/REPORTS:21021675::"Denies"} ?Difficulty using hands for taps, buttons, cutlery, and/or writing: {ACTIONS;DENIES/REPORTS:21021675::"Denies"} ? ?No Rheumatology ROS completed.  ? ?PMFS History:  ?Patient Active Problem List  ? Diagnosis Date Noted  ? Achilles tendon tear, right, initial encounter   ?  ?Past Medical History:  ?Diagnosis Date  ? ADHD (attention deficit hyperactivity disorder)   ? during elementary years  ? GERD (gastroesophageal reflux disease)   ?  ?Family History  ?Problem Relation Age of Onset  ? Hypertension Mother   ? ?Past Surgical History:  ?Procedure Laterality Date  ? ACHILLES TENDON SURGERY Right 03/20/2021  ? Procedure: RIGHT ACHILLES TENDON REPAIR;  Surgeon: 14/08/2020, MD;  Location: [facility];  Service: Orthopedics;  Laterality: Right;  ? OPEN REPAIR PERIARTICULAR FRACTURE  / DISLOCATION ELBOW    ? ?Social History  ? ?Social History Narrative  ? Not on file  ? ?Immunization History  ?Administered Date(s) Administered  ? Rabies, IM 09/22/2020, 09/29/2020, 10/17/2020  ? Tdap 07/16/2020  ?  ? ?Objective: ?Vital Signs: There were no vitals taken for this visit.  ? ?Physical Exam  ? ?Musculoskeletal Exam: *** ? ?CDAI Exam: ?CDAI Score: -- ?Patient Global: --; Provider Global: -- ?Swollen: --; Tender: -- ?Joint Exam 06/19/2021  ? ?No joint exam has been documented for this visit  ? ?There is currently no information documented on the homunculus. Go to the Rheumatology activity and complete the homunculus joint exam. ? ?Investigation: ?No additional findings. ? ?Imaging: ?No results found. ? ?Recent Labs: ?Lab Results  ?Component Value Date  ? WBC 4.6 03/20/2021  ? HGB 13.9 03/20/2021  ? PLT 152 03/20/2021  ? ? ?Speciality Comments: No specialty comments available. ? ?Procedures:  ?No procedures performed ?Allergies: Patient has no known allergies.  ? ?Assessment / Plan:     ?Visit Diagnoses: No diagnosis found. ? ?Orders: ?No orders of the defined types were placed in this encounter. ? ?No orders of the defined types were placed in this encounter. ? ? ?Face-to-face time spent with patient was *** minutes. Greater than 50% of time was spent in counseling and coordination of care. ? ?Follow-Up Instructions: No follow-ups on file. ? ? ?14/08/2020, MD ? ?Note - This record has been created using [employer] Plan.  ?Chart creation errors have been sought, but may not always  ?have been located. Such creation errors do not reflect on  ?the standard of medical care. ? ?

## 2021-06-19 ENCOUNTER — Ambulatory Visit: Payer: BC Managed Care – PPO | Admitting: Internal Medicine

## 2021-06-19 ENCOUNTER — Encounter (HOSPITAL_BASED_OUTPATIENT_CLINIC_OR_DEPARTMENT_OTHER): Payer: Self-pay | Admitting: Orthopaedic Surgery

## 2021-06-22 ENCOUNTER — Encounter (HOSPITAL_BASED_OUTPATIENT_CLINIC_OR_DEPARTMENT_OTHER): Payer: Self-pay | Admitting: Physical Therapy

## 2021-06-29 ENCOUNTER — Ambulatory Visit (HOSPITAL_BASED_OUTPATIENT_CLINIC_OR_DEPARTMENT_OTHER): Payer: Self-pay | Attending: Orthopaedic Surgery | Admitting: Physical Therapy

## 2021-06-29 ENCOUNTER — Ambulatory Visit (HOSPITAL_BASED_OUTPATIENT_CLINIC_OR_DEPARTMENT_OTHER): Payer: Self-pay | Admitting: Orthopaedic Surgery

## 2021-06-29 DIAGNOSIS — M25572 Pain in left ankle and joints of left foot: Secondary | ICD-10-CM | POA: Insufficient documentation

## 2021-06-29 DIAGNOSIS — M25672 Stiffness of left ankle, not elsewhere classified: Secondary | ICD-10-CM | POA: Insufficient documentation

## 2021-06-29 DIAGNOSIS — R2689 Other abnormalities of gait and mobility: Secondary | ICD-10-CM | POA: Insufficient documentation

## 2021-06-29 DIAGNOSIS — R600 Localized edema: Secondary | ICD-10-CM | POA: Insufficient documentation

## 2021-07-06 ENCOUNTER — Encounter (HOSPITAL_BASED_OUTPATIENT_CLINIC_OR_DEPARTMENT_OTHER): Payer: Self-pay | Admitting: Physical Therapy

## 2021-07-12 ENCOUNTER — Telehealth (HOSPITAL_BASED_OUTPATIENT_CLINIC_OR_DEPARTMENT_OTHER): Payer: Self-pay | Admitting: Orthopaedic Surgery

## 2021-07-12 NOTE — Telephone Encounter (Signed)
Pt would like to speak with Dr. Sammuel Hines ?

## 2021-07-17 ENCOUNTER — Ambulatory Visit: Payer: BC Managed Care – PPO | Admitting: Internal Medicine

## 2021-07-20 ENCOUNTER — Ambulatory Visit (HOSPITAL_BASED_OUTPATIENT_CLINIC_OR_DEPARTMENT_OTHER): Payer: Self-pay | Attending: Orthopaedic Surgery | Admitting: Physical Therapy

## 2021-07-20 DIAGNOSIS — M25572 Pain in left ankle and joints of left foot: Secondary | ICD-10-CM | POA: Insufficient documentation

## 2021-07-20 DIAGNOSIS — M25672 Stiffness of left ankle, not elsewhere classified: Secondary | ICD-10-CM | POA: Insufficient documentation

## 2021-07-20 DIAGNOSIS — R2689 Other abnormalities of gait and mobility: Secondary | ICD-10-CM | POA: Insufficient documentation

## 2021-07-20 DIAGNOSIS — R600 Localized edema: Secondary | ICD-10-CM | POA: Insufficient documentation

## 2022-05-15 ENCOUNTER — Other Ambulatory Visit (HOSPITAL_BASED_OUTPATIENT_CLINIC_OR_DEPARTMENT_OTHER): Payer: Self-pay | Admitting: Orthopaedic Surgery

## 2022-05-15 ENCOUNTER — Telehealth: Payer: Self-pay | Admitting: Orthopaedic Surgery

## 2022-05-15 DIAGNOSIS — M1A0710 Idiopathic chronic gout, right ankle and foot, without tophus (tophi): Secondary | ICD-10-CM

## 2022-05-15 NOTE — Telephone Encounter (Signed)
Patient called in wishing to speak with Sammuel Hines about follow up questions about surgery he had in December, patient states this is some what urgent

## 2022-05-15 NOTE — Telephone Encounter (Signed)
Referral placed in chart  

## 2022-06-08 ENCOUNTER — Telehealth: Payer: Self-pay | Admitting: Orthopaedic Surgery

## 2022-06-08 NOTE — Telephone Encounter (Signed)
Patient called. Would like to know if Dr. Sammuel Hines could refer him to a rheumatologist? His call back number is [phone]

## 2022-06-11 ENCOUNTER — Other Ambulatory Visit (HOSPITAL_BASED_OUTPATIENT_CLINIC_OR_DEPARTMENT_OTHER): Payer: Self-pay | Admitting: Orthopaedic Surgery

## 2022-06-11 ENCOUNTER — Telehealth: Payer: Self-pay | Admitting: Orthopaedic Surgery

## 2022-06-11 DIAGNOSIS — M1A0710 Idiopathic chronic gout, right ankle and foot, without tophus (tophi): Secondary | ICD-10-CM

## 2022-06-11 NOTE — Telephone Encounter (Signed)
Patient called in with Surgery Questions would like a call stated it is urgent

## 2022-06-11 NOTE — Telephone Encounter (Signed)
Referral placed to Dr. Benjamine Mola

## 2022-06-19 ENCOUNTER — Telehealth: Payer: Self-pay | Admitting: Orthopaedic Surgery

## 2022-06-19 NOTE — Telephone Encounter (Signed)
Patient called to let Dr. Sammuel Hines know that Dr Vernelle Emerald declined his referral. Patient asked if he can be referred to another Rheumatologist? Patient said [place] have [place] be [place]  The number to contact patient is [phone]

## 2022-06-21 ENCOUNTER — Telehealth: Payer: Self-pay | Admitting: Orthopaedic Surgery

## 2022-06-21 ENCOUNTER — Other Ambulatory Visit: Payer: Self-pay

## 2022-06-21 DIAGNOSIS — M1A0710 Idiopathic chronic gout, right ankle and foot, without tophus (tophi): Secondary | ICD-10-CM

## 2022-06-21 NOTE — Telephone Encounter (Signed)
Patient would a referral faxed to a new Dr Sampson Si [facility] and Ur [id number]) Dr Sampson Si [phone])

## 2022-06-22 NOTE — Telephone Encounter (Signed)
New rheumatology referral placed yesterday that was ok'd by Dr Sammuel Hines. See other note in chart.

## 2022-08-14 ENCOUNTER — Telehealth: Payer: Self-pay | Admitting: Orthopaedic Surgery

## 2022-08-14 NOTE — Telephone Encounter (Signed)
Referral refaxed to number given 

## 2022-08-14 NOTE — Telephone Encounter (Signed)
I called pt back. He stated he went to Dr. Rudean Hitt office and they dont have the referral. Can you please check on this. He said the faxed number they gave him was # [phone]

## 2022-08-14 NOTE — Telephone Encounter (Signed)
Pt called requesting an urgent call back from [facility]. Pt states Dr Steward Drone was to send some referral for him and they haven't been sent. Pt did not specify what type of referral. Only asked for a call back right away. Please call pt at [phone].

## 2022-09-03 ENCOUNTER — Telehealth (HOSPITAL_BASED_OUTPATIENT_CLINIC_OR_DEPARTMENT_OTHER): Payer: Self-pay | Admitting: Orthopaedic Surgery

## 2022-09-03 NOTE — Telephone Encounter (Signed)
Patient needs a referrals  sent to [facility]   in [facility] to Dr Lujean Amel  (F) ([phone]) can the referral be sent to his mychart  he states he has ask for this several times

## 2022-09-03 NOTE — Telephone Encounter (Signed)
Referral re faxed.

## 2023-01-30 IMAGING — CR DG FOOT COMPLETE 3+V*R*
3 series · 3 of 3 positions shown · non-contrast
Comparison: None.

CLINICAL DATA: Fall, injury, pain

EXAM:
RIGHT FOOT COMPLETE - 3+ VIEW

[x foot ap right]
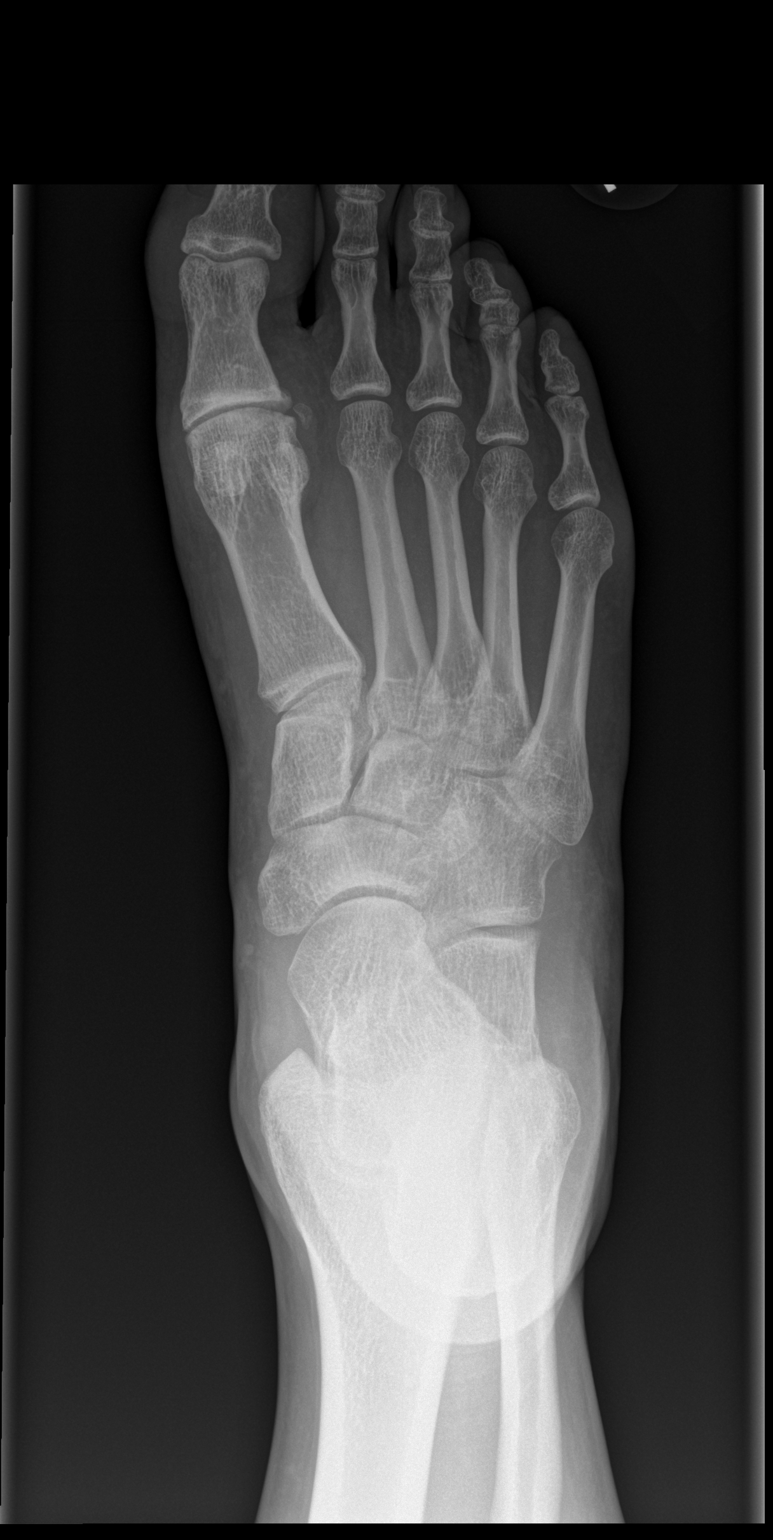

[x foot obl right]
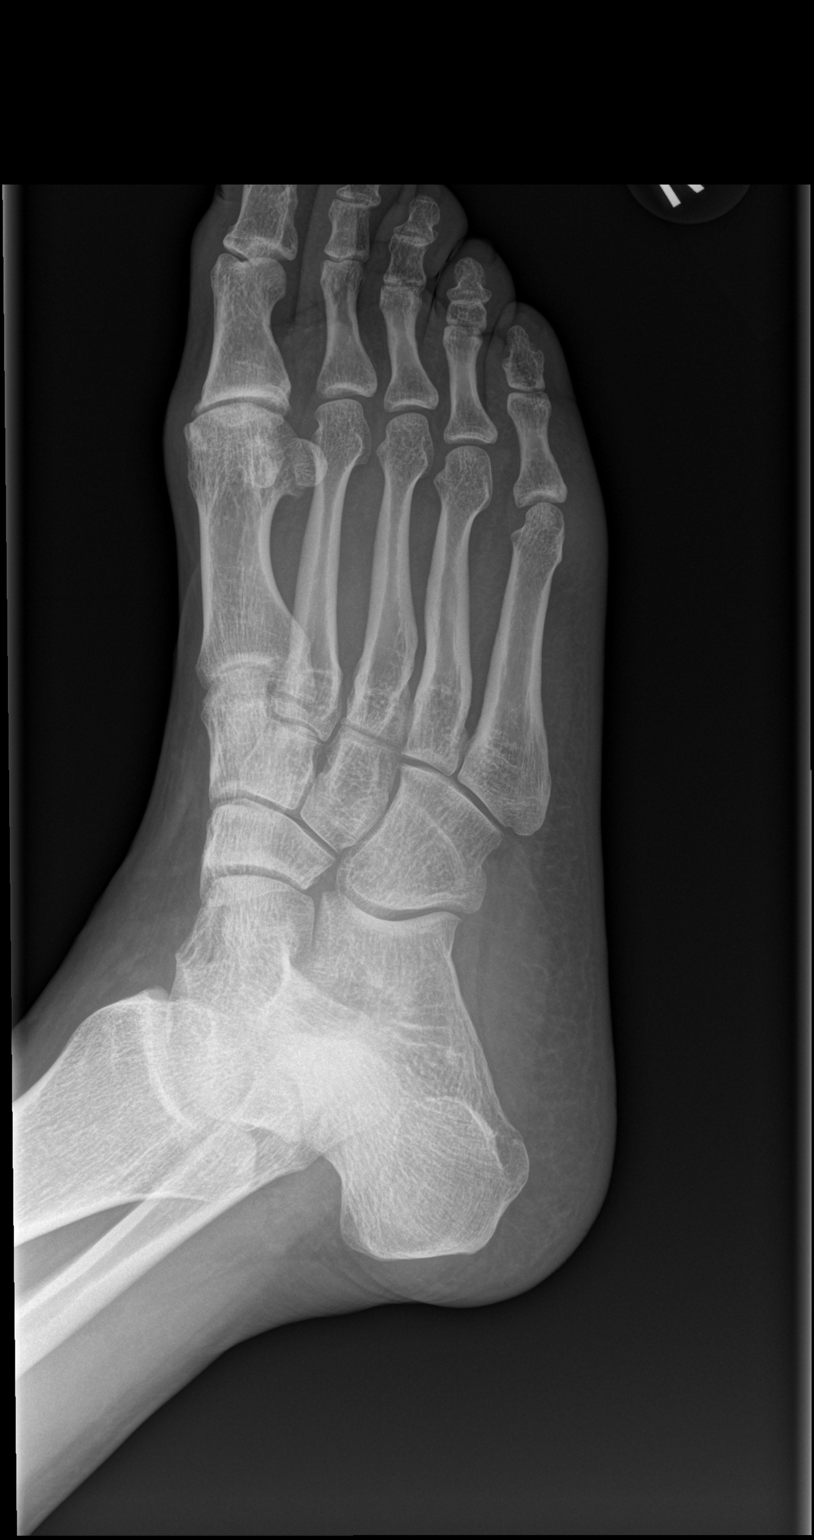

[x foot lat right]
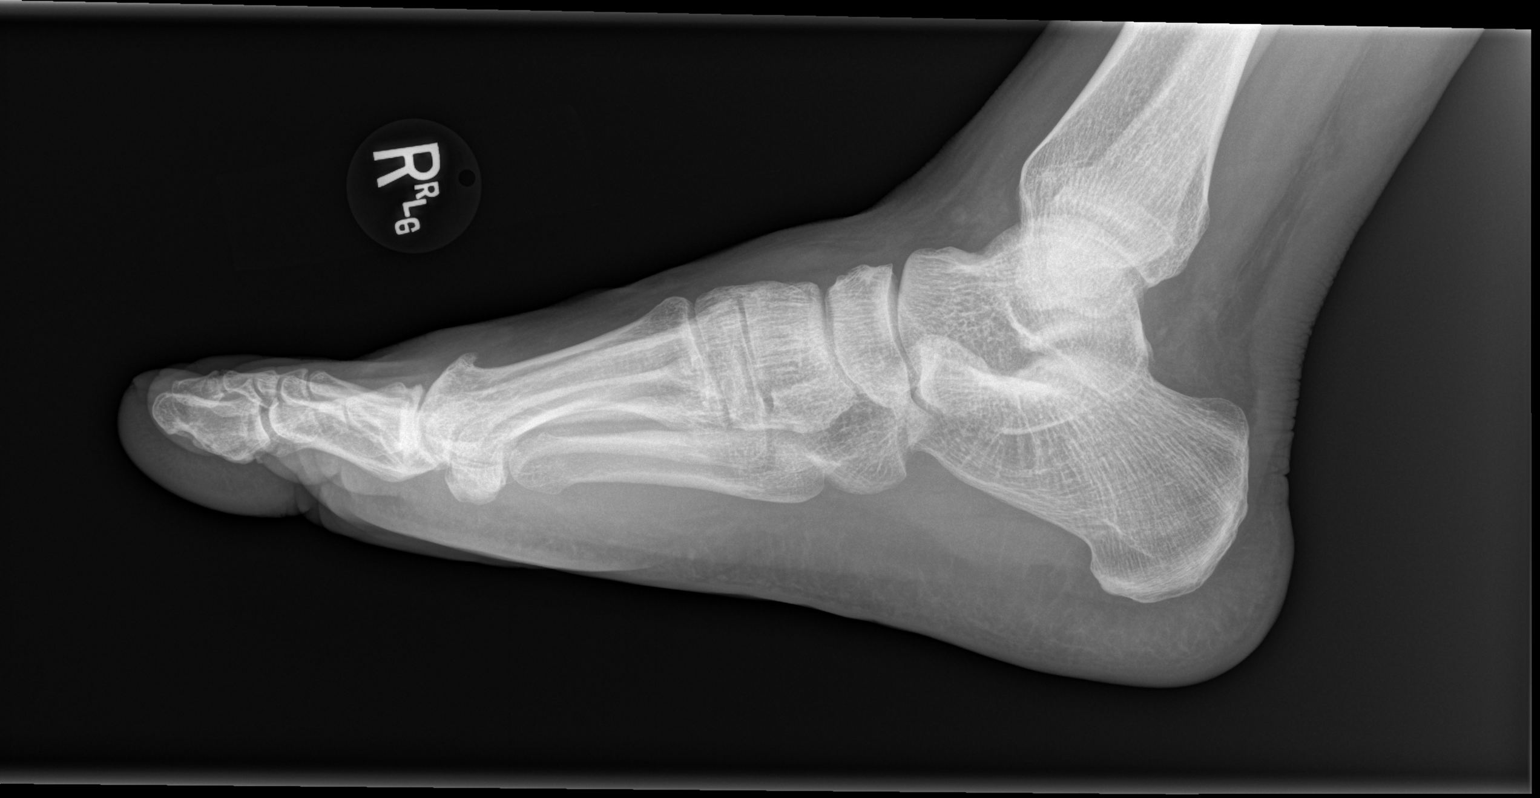

[3 of 3 positions shown; findings below may reference images not displayed]

FINDINGS: Degenerative osteoarthritis of the right first MTP joint with joint
space loss, sclerosis and bony spurring. No acute osseous finding,
fracture, or malalignment.

On the lateral view, there is slight soft tissue thickening
posteriorly along the Achilles tendon, overall nonspecific by plain
radiography.
IMPRESSION: Right first MTP joint osteoarthritis, compatible with hallux
rigidus.

No acute osseous finding

Nonspecific slight soft tissue thickening of the Achilles region
posteriorly. Correlate with exam.

## 2023-02-18 ENCOUNTER — Telehealth (HOSPITAL_BASED_OUTPATIENT_CLINIC_OR_DEPARTMENT_OTHER): Payer: Self-pay | Admitting: Orthopaedic Surgery

## 2023-02-18 NOTE — Telephone Encounter (Signed)
Patient wants a his medical records for everything that Dr B did for him  sent  Fax [phone]  Dr Lujean Amel [facility]. Contact patient [phone]

## 2023-02-19 ENCOUNTER — Telehealth: Payer: Self-pay | Admitting: Orthopaedic Surgery

## 2023-02-19 NOTE — Telephone Encounter (Signed)
Received call back from patient. He lives in [place]. He needs medical records and referral refaxed to [facility] that was originally faxed in march 2024. I refaxed the referral and records [phone] per patients request. Pts ph [phone]

## 2023-02-19 NOTE — Telephone Encounter (Signed)
IC, lmvm advising need signed authorization before medical records can be released. Advised to come to [address] to sign auth.
